# Patient Record
Sex: Male | Born: 1975 | Race: White | Hispanic: No | Marital: Married | Smoking: Never smoker
Health system: Southern US, Community
[De-identification: ages and names within clinical notes are randomized; demographics above are authoritative.]

## PROBLEM LIST (undated history)

## (undated) DIAGNOSIS — T7840XA Allergy, unspecified, initial encounter: Secondary | ICD-10-CM

## (undated) DIAGNOSIS — E7800 Pure hypercholesterolemia, unspecified: Secondary | ICD-10-CM

## (undated) HISTORY — PX: SHOULDER SURGERY: SHX246

## (undated) HISTORY — PX: THROAT SURGERY: SHX803

## (undated) HISTORY — DX: Allergy, unspecified, initial encounter: T78.40XA

## (undated) HISTORY — PX: NASAL SINUS SURGERY: SHX719

## (undated) HISTORY — PX: WISDOM TOOTH EXTRACTION: SHX21

---

## 2009-02-16 ENCOUNTER — Emergency Department (HOSPITAL_COMMUNITY): Admission: EM | Admit: 2009-02-16 | Discharge: 2009-02-16 | Payer: Self-pay | Admitting: Family Medicine

## 2010-05-02 LAB — CULTURE, ROUTINE-ABSCESS

## 2011-05-07 ENCOUNTER — Emergency Department (HOSPITAL_COMMUNITY)
Admission: EM | Admit: 2011-05-07 | Discharge: 2011-05-07 | Disposition: A | Discharge status: Home / Self Care | Payer: BC Managed Care – PPO | Attending: Emergency Medicine | Admitting: Emergency Medicine

## 2011-05-07 ENCOUNTER — Emergency Department (INDEPENDENT_AMBULATORY_CARE_PROVIDER_SITE_OTHER)
Admission: EM | Admit: 2011-05-07 | Discharge: 2011-05-07 | Disposition: A | Discharge status: Nursing Home (Includes ICF) | Payer: BC Managed Care – PPO | Source: Home / Self Care | Attending: Emergency Medicine | Admitting: Emergency Medicine

## 2011-05-07 ENCOUNTER — Encounter (HOSPITAL_COMMUNITY): Payer: Self-pay | Admitting: Emergency Medicine

## 2011-05-07 ENCOUNTER — Emergency Department (HOSPITAL_COMMUNITY): Payer: BC Managed Care – PPO

## 2011-05-07 DIAGNOSIS — R42 Dizziness and giddiness: Secondary | ICD-10-CM

## 2011-05-07 DIAGNOSIS — Z7982 Long term (current) use of aspirin: Secondary | ICD-10-CM | POA: Insufficient documentation

## 2011-05-07 DIAGNOSIS — Y9229 Other specified public building as the place of occurrence of the external cause: Secondary | ICD-10-CM | POA: Insufficient documentation

## 2011-05-07 DIAGNOSIS — S0990XA Unspecified injury of head, initial encounter: Secondary | ICD-10-CM

## 2011-05-07 DIAGNOSIS — F0781 Postconcussional syndrome: Secondary | ICD-10-CM | POA: Insufficient documentation

## 2011-05-07 DIAGNOSIS — IMO0002 Reserved for concepts with insufficient information to code with codable children: Secondary | ICD-10-CM | POA: Insufficient documentation

## 2011-05-07 DIAGNOSIS — E7800 Pure hypercholesterolemia, unspecified: Secondary | ICD-10-CM | POA: Insufficient documentation

## 2011-05-07 DIAGNOSIS — R110 Nausea: Secondary | ICD-10-CM

## 2011-05-07 HISTORY — DX: Pure hypercholesterolemia, unspecified: E78.00

## 2011-05-07 MED ORDER — TETANUS-DIPHTH-ACELL PERTUSSIS 5-2.5-18.5 LF-MCG/0.5 IM SUSP
0.5000 mL | Freq: Once | INTRAMUSCULAR | Status: AC
  Administered 2011-05-07: 0.5 mL via INTRAMUSCULAR
  Filled 2011-05-07: qty 0.5

## 2011-05-07 MED ORDER — METOCLOPRAMIDE HCL 10 MG PO TABS: 10.0000 mg | ORAL_TABLET | Freq: Four times a day (QID) | ORAL | Status: AC | PRN

## 2011-05-07 NOTE — ED Notes (Signed)
Pt states he believes that he gave himself a concussion 2 days ago. Pt walked into low beam. Small abrasion on top of head. Pt states dizzy, nausea, HA, and lightheaded.

## 2011-05-07 NOTE — Discharge Instructions (Signed)
Concussion and Brain Injury A blow or jolt to the head can disrupt the normal function of the brain. This type of brain injury is often called a "concussion" or a "closed head injury." Concussions are usually not life-threatening. Even so, the effects of a concussion can be serious.  CAUSES  A concussion is caused by a blunt blow to the head. The blow might be direct or indirect as described below.  Direct blow (running into another player during a soccer game, being hit in a fight, or hitting your head on a hard surface).   Indirect blow (when your head moves rapidly and violently back and forth like in a car crash).  SYMPTOMS  The brain is very complex. Every head injury is different. Some symptoms may appear right away. Other symptoms may not show up for days or weeks after the concussion. The signs of concussion can be hard to notice. Early on, problems may be missed by patients, family members, and caregivers. You may look fine even though you are acting or feeling differently.  These symptoms are usually temporary, but may last for days, weeks, or even longer. Symptoms include:  Mild headaches that will not go away.   Having more trouble than usual with:   Remembering things.   Paying attention or concentrating.   Organizing daily tasks.   Making decisions and solving problems.   Slowness in thinking, acting, speaking, or reading.   Getting lost or easily confused.   Feeling tired all the time or lacking energy (fatigue).   Feeling drowsy.   Sleep disturbances.   Sleeping more than usual.   Sleeping less than usual.   Trouble falling asleep.   Trouble sleeping (insomnia).   Loss of balance or feeling lightheaded or dizzy.   Nausea or vomiting.   Numbness or tingling.   Increased sensitivity to:   Sounds.   Lights.   Distractions.  Other symptoms might include:  Vision problems or eyes that tire easily.   Diminished sense of taste or smell.   Ringing  in the ears.   Mood changes such as feeling sad, anxious, or listless.   Becoming easily irritated or angry for little or no reason.   Lack of motivation.  DIAGNOSIS  Your caregiver can usually diagnose a concussion or mild brain injury based on your description of your injury and your symptoms.  Your evaluation might include:  A brain scan to look for signs of injury to the brain. Even if the test shows no injury, you may still have a concussion.   Blood tests to be sure other problems are not present.  TREATMENT   People with a concussion need to be examined and evaluated. Most people with concussions are treated in an emergency department, urgent care, or clinic. Some people must stay in the hospital overnight for further treatment.   Your caregiver will send you home with important instructions to follow. Be sure to carefully follow them.   Tell your caregiver if you are already taking any medicines (prescription, over-the-counter, or natural remedies), or if you are drinking alcohol or taking illegal drugs. Also, talk with your caregiver if you are taking blood thinners (anticoagulants) or aspirin. These drugs may increase your chances of complications. All of this is important information that may affect treatment.   Only take over-the-counter or prescription medicines for pain, discomfort, or fever as directed by your caregiver.  PROGNOSIS  How fast people recover from brain injury varies from person to person.   Although most people have a good recovery, how quickly they improve depends on many factors. These factors include how severe their concussion was, what part of the brain was injured, their age, and how healthy they were before the concussion.  Because all head injuries are different, so is recovery. Most people with mild injuries recover fully. Recovery can take time. In general, recovery is slower in older persons. Also, persons who have had a concussion in the past or have  other medical problems may find that it takes longer to recover from their current injury. Anxiety and depression may also make it harder to adjust to the symptoms of brain injury. HOME CARE INSTRUCTIONS  Return to your normal activities slowly, not all at once. You must give your body and brain enough time for recovery.  Get plenty of sleep at night, and rest during the day. Rest helps the brain to heal.   Avoid staying up late at night.   Keep the same bedtime hours on weekends and weekdays.   Take daytime naps or rest breaks when you feel tired.   Limit activities that require a lot of thought or concentration (brain or cognitive rest). This includes:   Homework or job-related work.   Watching TV.   Computer work.   Avoid activities that could lead to a second brain injury, such as contact or recreational sports, until your caregiver says it is okay. Even after your brain injury has healed, you should protect yourself from having another concussion.   Ask your caregiver when you can return to your normal activities such as driving, bicycling, or operating heavy equipment. Your ability to react may be slower after a brain injury.   Talk with your caregiver about when you can return to work or school.   Inform your teachers, school nurse, school counselor, coach, Product/process development scientist, or work Freight forwarder about your injury, symptoms, and restrictions. They should be instructed to report:   Increased problems with attention or concentration.   Increased problems remembering or learning new information.   Increased time needed to complete tasks or assignments.   Increased irritability or decreased ability to cope with stress.   Increased symptoms.   Take only those medicines that your caregiver has approved.   Do not drink alcohol until your caregiver says you are well enough to do so. Alcohol and certain other drugs may slow your recovery and can put you at risk of further injury.    If it is harder than usual to remember things, write them down.   If you are easily distracted, try to do one thing at a time. For example, do not try to watch TV while fixing dinner.   Talk with family members or close friends when making important decisions.   Keep all follow-up appointments. Repeated evaluation of your symptoms is recommended for your recovery.  PREVENTION  Protect your head from future injury. It is very important to avoid another head or brain injury before you have recovered. In rare cases, another injury has lead to permanent brain damage, brain swelling, or death. Avoid injuries by using:  Seatbelts when riding in a car.   Alcohol only in moderation.   A helmet when biking, skiing, skateboarding, skating, or doing similar activities.   Safety measures in your home.   Remove clutter and tripping hazards from floors and stairways.   Use grab bars in bathrooms and handrails by stairs.   Place non-slip mats on floors and in bathtubs.  Improve lighting in dim areas.  SEEK MEDICAL CARE IF:  A head injury can cause lingering symptoms. You should seek medical care if you have any of the following symptoms for more than 3 weeks after your injury or are planning to return to sports:  Chronic headaches.   Dizziness or balance problems.   Nausea.   Vision problems.   Increased sensitivity to noise or light.   Depression or mood swings.   Anxiety or irritability.   Memory problems.   Difficulty concentrating or paying attention.   Sleep problems.   Feeling tired all the time.  SEEK IMMEDIATE MEDICAL CARE IF:  You have had a blow or jolt to the head and you (or your family or friends) notice:  Severe or worsening headaches.   Weakness (even if only in one hand or one leg or one part of the face), numbness, or decreased coordination.   Repeated vomiting.   Increased sleepiness or passing out.   One black center of the eye (pupil) is larger  than the other.   Convulsions (seizures).   Slurred speech.   Increasing confusion, restlessness, agitation, or irritability.   Lack of ability to recognize people or places.   Neck pain.   Difficulty being awakened.   Unusual behavior changes.   Loss of consciousness.  Older adults with a brain injury may have a higher risk of serious complications such as a blood clot on the brain. Headaches that get worse or an increase in confusion are signs of this complication. If these signs occur, see a caregiver right away. MAKE SURE YOU:   Understand these instructions.   Will watch your condition.   Will get help right away if you are not doing well or get worse.  FOR MORE INFORMATION  Several groups help people with brain injury and their families. They provide information and put people in touch with local resources. These include support groups, rehabilitation services, and a variety of health care professionals. Among these groups, the Brain Injury Association (BIA, www.biausa.org) has a Secretary/administrator that gathers scientific and educational information and works on a national level to help people with brain injury.  Document Released: 04/23/2003 Document Revised: 01/20/2011 Document Reviewed: 09/19/2007 [facility] Patient Information 2012 [place], [place].  You have had a head injury which does not appear to require admission at this time. A concussion is a state of changed mental ability from trauma. SEEK IMMEDIATE MEDICAL ATTENTION IF: There is confusion or drowsiness (although children frequently become drowsy after injury).  You cannot awaken the injured person.  There is nausea (feeling sick to your stomach) or continued, forceful vomiting.  You notice dizziness or unsteadiness which is getting worse, or inability to walk.  You have convulsions or unconsciousness.  You experience severe, persistent headaches not relieved by Tylenol?. (Do not take aspirin as this impairs  clotting abilities). Take other pain medications only as directed.  You cannot use arms or legs normally.  There are changes in pupil sizes. (This is the black center in the colored part of the eye)  There is clear or bloody discharge from the nose or ears.  Change in speech, vision, swallowing, or understanding.  Localized weakness, numbness, tingling, or change in bowel or bladder control.

## 2011-05-07 NOTE — ED Notes (Signed)
Patient transported to CT 

## 2011-05-07 NOTE — ED Provider Notes (Signed)
History     CSN: [id number]  Arrival date & time 05/07/11  1907   First MD Initiated Contact with Patient 05/07/11 2012      Chief Complaint  Patient presents with  . Head Injury    (Consider location/radiation/quality/duration/timing/severity/associated sxs/prior treatment) HPI This 36 year old male was on a business accidentally bumped the top of his head on a low edema and a restaurant causing a headache at that time with some persistent very mild headache and some mild nausea since that time. He is no amnesia no loss of consciousness no neck pain no weakness no numbness, coordination and no change in speech vision swallowing or understanding. He again has no neck pain he also is no back pain chest pain shortness breath or abdominal pain. Since he has persistent very mild headache with some mild nausea he was seen by the urgent care and sent to the ED for CT scan of his brain. Upon arrival to the ED the patient was given the option of having a CT scan or not he has decided to go ahead and get a CT scan ordered. He also received a tetanus shot this visit because he has a superficial small abrasion in the scalp and her mother the last tetanus shot. Past Medical History  Diagnosis Date  . Migraine   . High cholesterol     Past Surgical History  Procedure Date  . Shoulder surgery   . Nasal sinus surgery     No family history on file.  History  Substance Use Topics  . Smoking status: Never Smoker   . Smokeless tobacco: Not on file  . Alcohol Use: Yes      Review of Systems  Constitutional: Negative for fever.       10 Systems reviewed and are negative for acute change except as noted in the HPI.  HENT: Negative for congestion.   Eyes: Negative for discharge and redness.  Respiratory: Negative for cough and shortness of breath.   Cardiovascular: Negative for chest pain.  Gastrointestinal: Positive for nausea. Negative for vomiting and abdominal pain.  Musculoskeletal:  Negative for back pain.  Skin: Negative for rash.  Neurological: Positive for headaches. Negative for dizziness, syncope, facial asymmetry, speech difficulty, weakness and numbness.  Psychiatric/Behavioral:       No behavior change.    Allergies  Review of patient's allergies indicates no known allergies.  Home Medications   Current Outpatient Rx  Name Route Sig Dispense Refill  . ASPIRIN 81 MG PO TABS Oral Take 81 mg by mouth daily.    . ATENOLOL PO Oral Take 1 tablet by mouth daily.     Marland Kitchen LIPITOR PO Oral Take 1 tablet by mouth daily.     . IBUPROFEN 200 MG PO TABS Oral Take 200 mg by mouth every 6 (six) hours as needed. For pain    . MAXALT-MLT PO Oral Take 1 tablet by mouth 2 (two) times daily as needed. For migraine     . METOCLOPRAMIDE HCL 10 MG PO TABS Oral Take 1 tablet (10 mg total) by mouth every 6 (six) hours as needed (nausea/headache). 6 tablet 0    BP 116/71  Pulse 48  Temp(Src) 98.2 F (36.8 C) (Oral)  Resp 18  SpO2 100%  Physical Exam  Nursing note and vitals reviewed. Constitutional:       Awake, alert, nontoxic appearance with baseline speech for patient.  HENT:  Mouth/Throat: No oropharyngeal exudate.       Minimal superficial  abrasion vertex of the scalp  Eyes: EOM are normal. Pupils are equal, round, and reactive to light. Right eye exhibits no discharge. Left eye exhibits no discharge.  Neck: Neck supple.  Cardiovascular: Normal rate and regular rhythm.   No murmur heard. Pulmonary/Chest: Effort normal and breath sounds normal. No stridor. No respiratory distress. He has no wheezes. He has no rales. He exhibits no tenderness.  Abdominal: Soft. Bowel sounds are normal. He exhibits no mass. There is no tenderness. There is no rebound.  Musculoskeletal: He exhibits no tenderness.       Baseline ROM, moves extremities with no obvious new focal weakness.  Lymphadenopathy:    He has no cervical adenopathy.  Neurological: He is alert.       Awake, alert,  cooperative and aware of situation; motor strength bilaterally; sensation normal to light touch bilaterally; peripheral visual fields full to confrontation; no facial asymmetry; tongue midline; major cranial nerves appear intact; no pronator drift, normal finger to nose bilaterally, baseline gait without new ataxia.  Skin: No rash noted.  Psychiatric: He has a normal mood and affect.    ED Course  Procedures (including critical care time)  Labs Reviewed - No data to display Ct Head Wo Contrast  05/07/2011  *RADIOLOGY REPORT*  Clinical Data: Hit on top of head 5 days ago, laceration, slight headache, nausea, dizziness  CT HEAD WITHOUT CONTRAST  Technique:  Contiguous axial images were obtained from the base of the skull through the vertex without contrast.  Comparison: None  Findings: Normal ventricular morphology. No midline shift or mass effect. Normal appearance of brain parenchyma. No intracranial hemorrhage, mass lesion, or acute infarction. Visualized paranasal sinuses and mastoid air cells clear. Bones unremarkable.  IMPRESSION: No acute intracranial abnormalities.  Original Report Authenticated By: Lollie Marrow, M.D.     1. Postconcussive syndrome       MDM  Patient / Family / Caregiver understand and agree with initial ED impression and plan with expectations set for ED visit.  Pt stable in ED with no significant deterioration in condition.  Patient / Family / Caregiver informed of clinical course, understand medical decision-making process, and agree with plan.  I doubt any other EMC precluding discharge at this time including, but not necessarily limited to the following:[facility].        Hurman Horn, MD 05/09/11 [phone]

## 2011-05-07 NOTE — ED Notes (Addendum)
Patient ran into a low lying ceiling.  Patient reports concentration issues, intermitent headaches, fussy headed, intermittent, mild nausea.  No loc.  Patient was out of town for this event.  Returned to town last night.  Patient does have a scabbed scratch to top of head

## 2011-05-07 NOTE — ED Notes (Signed)
Unknown last tetanus

## 2011-05-07 NOTE — ED Provider Notes (Signed)
History     CSN: [id number]  Arrival date & time 05/07/11  1644   First MD Initiated Contact with Patient 05/07/11 1648      Chief Complaint  Patient presents with  . Head Injury    (Consider location/radiation/quality/duration/timing/severity/associated sxs/prior treatment) HPI Comments: Patient presents urgent care tonight complaining of ongoing symptoms which includes dizziness, feeling lightheaded, mild nausea, after having sustained a head injury to his last Tuesday. As patient described he was walking in a restaurant when he hit and ran into a wooden pole and a restaurant. Patient describes that he didn't lose consciousness, but was feeling very hot and somewhat disoriented and clumsy with his movements and even with some ambulation problems was feeling dizzy and lightheaded.  Patient describes that he continues to feel dizzy and feels nauseous mildly have not vomited, and denies any other further symptoms such as muscle weakness, numbness or tingling, no visual disturbances or changes in have not vomited.  Patient is a 36 y.o. male presenting with head injury. The history is provided by the patient.  Head Injury  The incident occurred more than 2 days ago. He came to the ER via walk-in. The injury mechanism was a direct blow. There was no loss of consciousness. There was no blood loss. The quality of the pain is described as sharp. The pain is at a severity of 6/10. The pain has been constant since the injury. Pertinent negatives include no numbness, no blurred vision, no vomiting, no tinnitus, no disorientation, no weakness and no memory loss. He was found conscious by EMS personnel. He has tried NSAIDs for the symptoms. The treatment provided mild relief.    Past Medical History  Diagnosis Date  . Migraine   . High cholesterol     Past Surgical History  Procedure Date  . Shoulder surgery   . Nasal sinus surgery     No family history on file.  History  Substance Use  Topics  . Smoking status: Never Smoker   . Smokeless tobacco: Not on file  . Alcohol Use: Yes      Review of Systems  Constitutional: Negative for chills and diaphoresis.  HENT: Negative for hearing loss, neck pain, neck stiffness and tinnitus.   Eyes: Negative for blurred vision, pain and visual disturbance.  Gastrointestinal: Positive for nausea. Negative for vomiting.  Genitourinary: Negative for dysuria.  Musculoskeletal: Negative for joint swelling.  Neurological: Positive for dizziness. Negative for tremors, syncope, speech difficulty, weakness and numbness.  Psychiatric/Behavioral: Negative for memory loss.    Allergies  Review of patient's allergies indicates no known allergies.  Home Medications   Current Outpatient Rx  Name Route Sig Dispense Refill  . ASPIRIN 81 MG PO TABS Oral Take 81 mg by mouth daily.    . ATENOLOL PO Oral Take by mouth.    Marland Kitchen LIPITOR PO Oral Take by mouth.    . IBUPROFEN 200 MG PO TABS Oral Take 200 mg by mouth every 6 (six) hours as needed.    Marland Kitchen MAXALT-MLT PO Oral Take by mouth.      BP 111/65  Pulse 54  Temp(Src) 98.3 F (36.8 C) (Oral)  Resp 16  SpO2 100%  Physical Exam  Nursing note and vitals reviewed. Constitutional: He appears well-developed and well-nourished.  HENT:  Head: Normocephalic.    Eyes: Conjunctivae are normal. Pupils are equal, round, and reactive to light. No scleral icterus. Right pupil is reactive. Left pupil is reactive.  Neck: Neck supple. No JVD  present.  Cardiovascular: Normal rate.   Pulmonary/Chest: Effort normal.  Abdominal: Soft.  Lymphadenopathy:    He has no cervical adenopathy.  Neurological: He is alert. He displays no atrophy, no tremor and normal reflexes. No cranial nerve deficit or sensory deficit. He exhibits normal muscle tone. He displays a negative Romberg sign. He displays no seizure activity. Coordination and gait normal.  Skin: Skin is warm. No erythema.    ED Course  Procedures  (including critical care time)  Labs Reviewed - No data to display No results found.   No diagnosis found.    MDM  Post head injury 5 days patient describing multiple symptomatology which includes dizziness, nauseous, lightheadedness, concentration. Patient has a normal neurological exam at urgent care. On exam patient seem to have a localized area of a skull periostial reaction. Have decided to transfer patient to be consider for imaging as patient continues to feel nauseous and dizzy.        Jimmie Molly, MD 05/07/11 [phone]

## 2012-10-13 ENCOUNTER — Encounter (HOSPITAL_COMMUNITY): Payer: Self-pay

## 2012-10-13 ENCOUNTER — Emergency Department (HOSPITAL_COMMUNITY)
Admission: EM | Admit: 2012-10-13 | Discharge: 2012-10-13 | Disposition: A | Discharge status: Home / Self Care | Payer: BC Managed Care – PPO | Attending: Emergency Medicine | Admitting: Emergency Medicine

## 2012-10-13 DIAGNOSIS — E7800 Pure hypercholesterolemia, unspecified: Secondary | ICD-10-CM | POA: Insufficient documentation

## 2012-10-13 DIAGNOSIS — R1310 Dysphagia, unspecified: Secondary | ICD-10-CM | POA: Insufficient documentation

## 2012-10-13 DIAGNOSIS — Z79899 Other long term (current) drug therapy: Secondary | ICD-10-CM | POA: Diagnosis not present

## 2012-10-13 DIAGNOSIS — IMO0002 Reserved for concepts with insufficient information to code with codable children: Secondary | ICD-10-CM | POA: Insufficient documentation

## 2012-10-13 DIAGNOSIS — G43909 Migraine, unspecified, not intractable, without status migrainosus: Secondary | ICD-10-CM | POA: Diagnosis not present

## 2012-10-13 DIAGNOSIS — Z7982 Long term (current) use of aspirin: Secondary | ICD-10-CM | POA: Insufficient documentation

## 2012-10-13 DIAGNOSIS — Y838 Other surgical procedures as the cause of abnormal reaction of the patient, or of later complication, without mention of misadventure at the time of the procedure: Secondary | ICD-10-CM | POA: Diagnosis not present

## 2012-10-13 DIAGNOSIS — J029 Acute pharyngitis, unspecified: Secondary | ICD-10-CM | POA: Diagnosis not present

## 2012-10-13 NOTE — Consult Note (Signed)
Patient had a right tonsillectomy yesterday at the outpatient center for some sort of neoplasm that was felt to be benign. He started having some bleeding a couple of hours ago and it seemed to be getting a little bit worse. He tried gargling with ice water to no avail. He is otherwise are healthy.  On exam, there is fresh blood coming from the right superior tonsil pole from what appears to be a small arterial. I had him gargle with icewater/peroxide and I was able to inject 1% Xylocaine with epinephrine into the superior tonsillar pole. The bleeding stopped. I cleaned off the blood clot. There was no further bleeding. There were 2 suspicious-looking areas in the right superior tonsillar bed that I cauterized with silver nitrate. There was is still no further bleeding.  He is instructed to resume his diet and to contact me if there is any further bleeding.

## 2012-10-13 NOTE — ED Notes (Signed)
Pt. Had surgery done yesterday, throat surgery mass removed.  Began actively bleeding about 1 hour ago,.   Ent Dr. Pollyann Kennedy is on his way

## 2012-10-13 NOTE — ED Provider Notes (Signed)
CSN: [id number]     Arrival date & time 10/13/12  1251 History   First MD Initiated Contact with Patient 10/13/12 1304     Chief Complaint  Patient presents with  . Bleeding/Bruising   (Consider location/radiation/quality/duration/timing/severity/associated sxs/prior Treatment) HPI Comments: Patient with bleeding x 1hr from site of tonsillar cyst surgery performed yesterday. Patient tried gargling with cold water PTA without relief. No N/V. No lightheadedness or syncope. Pt called ENT and was told to go to ED, Dr. Pollyann Kennedy to see. The onset of this condition was acute. The course is constant. Aggravating factors: none. Alleviating factors: none.     The history is provided by the patient.    Past Medical History  Diagnosis Date  . Migraine   . High cholesterol    Past Surgical History  Procedure Laterality Date  . Shoulder surgery    . Nasal sinus surgery     No family history on file. History  Substance Use Topics  . Smoking status: Never Smoker   . Smokeless tobacco: Not on file  . Alcohol Use: Yes    Review of Systems  Constitutional: Negative for fever.  HENT: Positive for sore throat and trouble swallowing. Negative for facial swelling.        + bleeding from throat  Gastrointestinal: Negative for nausea and vomiting.  Skin: Positive for wound.  Neurological: Negative for syncope and light-headedness.    Allergies  Review of patient's allergies indicates no known allergies.  Home Medications   Current Outpatient Rx  Name  Route  Sig  Dispense  Refill  . aspirin 81 MG tablet   Oral   Take 81 mg by mouth daily.         . ATENOLOL PO   Oral   Take 1 tablet by mouth daily.          . Atorvastatin Calcium (LIPITOR PO)   Oral   Take 1 tablet by mouth daily.          Marland Kitchen ibuprofen (ADVIL,MOTRIN) 200 MG tablet   Oral   Take 200 mg by mouth every 6 (six) hours as needed. For pain         . Rizatriptan Benzoate (MAXALT-MLT PO)   Oral   Take 1 tablet by  mouth 2 (two) times daily as needed. For migraine           BP 103/87  Pulse 116  Resp 18  SpO2 99% Physical Exam  Nursing note and vitals reviewed. Constitutional: He appears well-developed and well-nourished.  HENT:  Head: Normocephalic and atraumatic.  Edema R tonsillar area with oozing of blood noted. No other lesions or facial swelling. Patient handling secretions.   Eyes: Conjunctivae are normal.  Neck: Normal range of motion. Neck supple.  Pulmonary/Chest: No respiratory distress.  Neurological: He is alert.  Skin: Skin is warm and dry.  Psychiatric: He has a normal mood and affect.    ED Course  Procedures (including critical care time) Labs Review Labs Reviewed - No data to display Imaging Review No results found.  1:14 PM Patient seen and examined. Dr. Pollyann Kennedy at bedside.    Vital signs reviewed and are as follows: Filed Vitals:   10/13/12 1258  BP: 103/87  Pulse: 116  Resp: 18   1:52 PM Dr. Pollyann Kennedy has performed cauterization. Patient ready for discharge.    MDM   1. Postoperative hemorrhage of tonsil   2. Postoperative bleeding from mouth    Bleeding after ENT procedure  yesterday -- controlled. D/c to home. Do not suspect significant anemia.     Renne Crigler, PA-C 10/13/12 1353

## 2012-10-14 NOTE — ED Provider Notes (Signed)
Medical screening examination/treatment/procedure(s) were performed by non-physician practitioner and as supervising physician I was immediately available for consultation/collaboration.   Claudean Kinds, MD 10/14/12 [phone]
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
## 2016-03-01 DIAGNOSIS — E785 Hyperlipidemia, unspecified: Secondary | ICD-10-CM | POA: Insufficient documentation

## 2016-03-01 DIAGNOSIS — G43909 Migraine, unspecified, not intractable, without status migrainosus: Secondary | ICD-10-CM | POA: Insufficient documentation

## 2017-05-15 DIAGNOSIS — R6889 Other general symptoms and signs: Secondary | ICD-10-CM | POA: Diagnosis not present

## 2017-11-29 DIAGNOSIS — E7849 Other hyperlipidemia: Secondary | ICD-10-CM | POA: Diagnosis not present

## 2017-11-29 DIAGNOSIS — G43909 Migraine, unspecified, not intractable, without status migrainosus: Secondary | ICD-10-CM | POA: Diagnosis not present

## 2017-11-29 DIAGNOSIS — J309 Allergic rhinitis, unspecified: Secondary | ICD-10-CM | POA: Diagnosis not present

## 2018-02-26 DIAGNOSIS — H9202 Otalgia, left ear: Secondary | ICD-10-CM | POA: Insufficient documentation

## 2018-02-26 DIAGNOSIS — M2669 Other specified disorders of temporomandibular joint: Secondary | ICD-10-CM | POA: Insufficient documentation

## 2018-03-30 ENCOUNTER — Encounter (HOSPITAL_COMMUNITY): Payer: Self-pay | Admitting: Emergency Medicine

## 2018-03-30 ENCOUNTER — Emergency Department (HOSPITAL_COMMUNITY): Payer: 59

## 2018-03-30 ENCOUNTER — Emergency Department (HOSPITAL_COMMUNITY)
Admission: EM | Admit: 2018-03-30 | Discharge: 2018-03-30 | Disposition: A | Discharge status: Home / Self Care | Payer: 59 | Attending: Emergency Medicine | Admitting: Emergency Medicine

## 2018-03-30 ENCOUNTER — Other Ambulatory Visit: Payer: Self-pay

## 2018-03-30 DIAGNOSIS — Z7982 Long term (current) use of aspirin: Secondary | ICD-10-CM | POA: Insufficient documentation

## 2018-03-30 DIAGNOSIS — R42 Dizziness and giddiness: Secondary | ICD-10-CM | POA: Diagnosis not present

## 2018-03-30 DIAGNOSIS — R0789 Other chest pain: Secondary | ICD-10-CM | POA: Insufficient documentation

## 2018-03-30 DIAGNOSIS — E7800 Pure hypercholesterolemia, unspecified: Secondary | ICD-10-CM | POA: Insufficient documentation

## 2018-03-30 DIAGNOSIS — Z79899 Other long term (current) drug therapy: Secondary | ICD-10-CM | POA: Diagnosis not present

## 2018-03-30 DIAGNOSIS — R079 Chest pain, unspecified: Secondary | ICD-10-CM | POA: Diagnosis present

## 2018-03-30 LAB — CBC
HEMATOCRIT: 45.2 % (ref 39.0–52.0)
Hemoglobin: 15.5 g/dL (ref 13.0–17.0)
MCH: 32.2 pg (ref 26.0–34.0)
MCHC: 34.3 g/dL (ref 30.0–36.0)
MCV: 94 fL (ref 80.0–100.0)
NRBC: 0 % (ref 0.0–0.2)
PLATELETS: 339 10*3/uL (ref 150–400)
RBC: 4.81 MIL/uL (ref 4.22–5.81)
RDW: 11.8 % (ref 11.5–15.5)
WBC: 6.7 10*3/uL (ref 4.0–10.5)

## 2018-03-30 LAB — BASIC METABOLIC PANEL
Anion gap: 12 (ref 5–15)
BUN: 14 mg/dL (ref 6–20)
CALCIUM: 9.4 mg/dL (ref 8.9–10.3)
CO2: 25 mmol/L (ref 22–32)
CREATININE: 1.11 mg/dL (ref 0.61–1.24)
Chloride: 102 mmol/L (ref 98–111)
GFR calc Af Amer: >60 mL/min (ref >60)
GFR calc non Af Amer: >60 mL/min (ref >60)
GLUCOSE: 139 mg/dL — AB (ref 70–99)
Potassium: 3.6 mmol/L (ref 3.5–5.1)
Sodium: 139 mmol/L (ref 135–145)

## 2018-03-30 LAB — I-STAT TROPONIN, ED
TROPONIN I, POC: 0 ng/mL (ref 0.00–0.08)
Troponin i, poc: 0.02 ng/mL (ref 0.00–0.08)

## 2018-03-30 MED ORDER — SODIUM CHLORIDE 0.9% FLUSH: 3.0000 mL | Freq: Once | INTRAVENOUS | Status: DC

## 2018-03-30 NOTE — ED Notes (Signed)
Patient transported to x-ray. ?

## 2018-03-30 NOTE — ED Notes (Signed)
Patient transported to X-ray 

## 2018-03-30 NOTE — ED Triage Notes (Signed)
Pt to ER for evaluation of central chest discomfort, shortness of breath, and light headedness onset one hour ago while sitting at his desk at work. Hx of hyperlipidemia and migraines.

## 2018-03-30 NOTE — ED Provider Notes (Signed)
[facility] EMERGENCY DEPARTMENT Provider Note   CSN: [id number] Arrival date & time: 03/30/18  1122     History   Chief Complaint Chief Complaint  Patient presents with  . Chest Pain  . Shortness of Breath    HPI Antonio Craig is a 43 y.o. male with a PMH of HLD and Migraines presenting with middle non radiating chest discomfort onset 1 hour ago while sitting at his desk at work. Patient describes pain as a tightness and states nothing makes it worse. Patient states pain has improved with rest. Patient reports associated shortness of breath initially, but states symptoms have improved. Patient reports nausea, but states it has resolved. Patient denies vomiting or abdominal pain. Patient states he felt lightheadedness prior to the chest discomfort. Patient reports anxiety. Patient denies dizziness, weakness, or headaches. Patient denies recent travel, recent surgery, leg edema/pain, or hx DVT/PE. Patient denies a personal or family hx of cardiac problems. Patient denies alcohol, tobacco, or drug use. Patient denies any injuries.   HPI  Past Medical History:  Diagnosis Date  . High cholesterol   . Migraine     There are no active problems to display for this patient.   Past Surgical History:  Procedure Laterality Date  . NASAL SINUS SURGERY    . SHOULDER SURGERY          Home Medications    Prior to Admission medications   Medication Sig Start Date End Date Taking? Authorizing Provider  AJOVY 225 MG/1.5ML SOSY Inject 675 mg into the skin every 3 (three) months.  03/26/18  Yes [provider]  aspirin 81 MG tablet Take 81 mg by mouth daily.   Yes [provider]  atorvastatin (LIPITOR) 20 MG tablet Take 20 mg by mouth daily. 02/28/18  Yes [provider]  ibuprofen (ADVIL,MOTRIN) 200 MG tablet Take 200 mg by mouth every 6 (six) hours as needed (for pain).    Yes [provider]  SUMAtriptan (IMITREX) 50 MG tablet Take 50  mg by mouth once as needed for migraine.  03/23/18  Yes [provider]    Family History History reviewed. No pertinent family history.  Social History Social History   Tobacco Use  . Smoking status: Never Smoker  Substance Use Topics  . Alcohol use: Yes  . Drug use: No     Allergies   Patient has no known allergies.   Review of Systems Review of Systems  Constitutional: Negative for activity change, appetite change, chills, diaphoresis, fatigue, fever and unexpected weight change.  HENT: Negative for congestion and rhinorrhea.   Eyes: Negative for visual disturbance.  Respiratory: Positive for chest tightness and shortness of breath. Negative for cough and wheezing.   Cardiovascular: Positive for chest pain. Negative for palpitations and leg swelling.  Gastrointestinal: Negative for abdominal pain, nausea and vomiting.  Endocrine: Negative for cold intolerance and heat intolerance.  Musculoskeletal: Negative for back pain.  Skin: Negative for rash.  Allergic/Immunologic: Negative for immunocompromised state.  Neurological: Positive for light-headedness. Negative for dizziness, seizures, syncope, speech difficulty, weakness, numbness and headaches.  Psychiatric/Behavioral: Negative for agitation and behavioral problems. The patient is nervous/anxious.      Physical Exam Updated Vital Signs BP (!) 141/82 (BP Location: Right Arm)   Pulse 93   Temp 97.7 F (36.5 C) (Oral)   Resp 18   Ht 6\' 4"  (1.93 m)   Wt 97.5 kg   SpO2 100%   BMI 26.17 kg/m  Physical Exam Vitals signs and nursing note reviewed.  Constitutional:      General: He is not in acute distress.    Appearance: He is well-developed. He is not diaphoretic.  HENT:     Head: Normocephalic and atraumatic.  Eyes:     Extraocular Movements: Extraocular movements intact.     Pupils: Pupils are equal, round, and reactive to light.  Neck:     Musculoskeletal: Normal range of motion and neck supple.       Vascular: No JVD.  Cardiovascular:     Rate and Rhythm: Normal rate and regular rhythm.     Pulses: Normal pulses.          Radial pulses are 2+ on the right side and 2+ on the left side.       Dorsalis pedis pulses are 2+ on the right side and 2+ on the left side.     Heart sounds: Normal heart sounds. No murmur. No friction rub. No gallop.   Pulmonary:     Effort: Pulmonary effort is normal. No respiratory distress.     Breath sounds: Normal breath sounds. No wheezing or rales.  Chest:     Chest wall: No tenderness.  Abdominal:     Palpations: Abdomen is soft.     Tenderness: There is no abdominal tenderness.  Musculoskeletal: Normal range of motion.     Right lower leg: He exhibits no tenderness. No edema.     Left lower leg: He exhibits no tenderness. No edema.  Skin:    General: Skin is warm.     Capillary Refill: Capillary refill takes less than 2 seconds.     Coloration: Skin is not pale.     Findings: No rash.  Neurological:     Mental Status: He is alert and oriented to person, place, and time.  Psychiatric:        Attention and Perception: Attention normal.        Mood and Affect: Mood is anxious.        Speech: Speech normal.        Behavior: Behavior normal. Behavior is cooperative.        Thought Content: Thought content normal.        Cognition and Memory: Cognition normal.    Mental Status:  Alert, oriented, thought content appropriate, able to give a coherent history. Speech fluent without evidence of aphasia. Able to follow 2 step commands without difficulty.  Cranial Nerves:  II:  Peripheral visual fields grossly normal, pupils equal, round, reactive to light III,IV, VI: ptosis not present, extra-ocular motions intact bilaterally  V,VII: smile symmetric, facial light touch sensation equal VIII: hearing grossly normal to voice  X: uvula elevates symmetrically  XI: bilateral shoulder shrug symmetric and strong XII: midline tongue extension without  fassiculations Motor:  Normal tone. 5/5 in upper and lower extremities bilaterally including strong and equal grip strength and dorsiflexion/plantar flexion Sensory: light touch normal in all extremities.  Deep Tendon Reflexes: 2+ and symmetric in the biceps and patella Cerebellar: normal finger-to-nose with bilateral upper extremities Gait: normal gait and balance.  CV: distal pulses palpable throughout   ED Treatments / Results  Labs (all labs ordered are listed, but only abnormal results are displayed) Labs Reviewed  BASIC METABOLIC PANEL - Abnormal; Notable for the following components:      Result Value   Glucose, Bld 139 (*)    All other components within normal limits  CBC  I-STAT TROPONIN, ED  I-STAT TROPONIN, ED    EKG EKG Interpretation  Date/Time:  Friday March 30 2018 11:32:09 EST Ventricular Rate:  72 PR Interval:  158 QRS Duration: 106 QT Interval:  380 QTC Calculation: 416 R Axis:   68 Text Interpretation:  Normal sinus rhythm with sinus arrhythmia no acute ST/T changes No old tracing to compare Confirmed by Pricilla LovelessGoldston, [place] [phone]([id number]) on 03/30/2018 11:43:39 AM Also confirmed by Pricilla LovelessGoldston, [place] [phone]), editor Elita QuickWatlington, [place] [phone]([id number])  on 03/30/2018 2:03:03 PM   Radiology Dg Chest 2 View  Result Date: 03/30/2018 CLINICAL DATA:  Chest pain.  Dizziness and weakness. EXAM: CHEST - 2 VIEW COMPARISON:  None. FINDINGS: The heart size and mediastinal contours are within normal limits. Both lungs are clear. The visualized skeletal structures are unremarkable. IMPRESSION: Normal exam. Electronically Signed   By: Francene BoyersJames  Maxwell M.D.   On: 03/30/2018 12:52    Procedures Procedures (including critical care time)  Medications Ordered in ED Medications  sodium chloride flush (NS) 0.9 % injection 3 mL (3 mLs Intravenous Not Given 03/30/18 1227)     Initial Impression / Assessment and Plan / ED Course  I have reviewed the triage vital signs and the nursing  notes.  Pertinent labs & imaging results that were available during my care of the patient were reviewed by me and considered in my medical decision making (see chart for details).  Clinical Course as of Mar 30 1617  Fri Mar 30, 2018  1257 Negative CXR for an acute abnormality.  DG Chest 2 View [AH]    Clinical Course User Index [AH] Leretha DykesHernandez, Lakayla Barrington P, [place]PA-C   Patient presents with chest discomfort and lightheadedness. Symptoms have resolved while in the ER. Patient is to be discharged with recommendation to follow up with PCP in regards to today's hospital visit. Chest pain is not likely of cardiac or pulmonary etiology d/t presentation, PERC negative, VSS, no tracheal deviation, no JVD or new murmur, RRR, breath sounds equal bilaterally, EKG without acute abnormalities, negative troponin x 2, and negative CXR. Pt has been advised to return to the ED if CP becomes exertional, associated with diaphoresis or nausea, radiates to left jaw/arm, worsens or becomes concerning in any way. Heart score is 1. Discussed elevated BP with patient and advised patient to follow up with PCP for BP control. Pt appears reliable for follow up and is agreeable to discharge.   Final Clinical Impressions(s) / ED Diagnoses   Final diagnoses:  Atypical chest pain  Lightheadedness    ED Discharge Orders    None       Leretha DykesHernandez, Tiziana Cislo P, [place]PA-C 03/30/18 1619    Pricilla LovelessGoldston, [place], MD 03/31/18 [phone]

## 2018-03-30 NOTE — ED Notes (Signed)
Reports chest discomfort didn't start til in the car driving here.

## 2018-03-30 NOTE — Discharge Instructions (Addendum)
You have been seen today for chest pain and lightheadedness. Please read and follow all provided instructions.   1. Medications: usual home medications 2. Treatment: rest, drink plenty of fluids 3. Follow Up: Please follow up with your primary doctor in 2 days for discussion of your diagnoses and further evaluation after today's visit; if you do not have a primary care doctor use the resource guide provided to find one; Please return to the ER for any new or worsening symptoms. Please obtain all of your results from medical records or have your doctors office obtain the results - share them with your doctor - you should be seen at your doctors office. Call today to arrange your follow up.   Take medications as prescribed. Please review all of the medicines and only take them if you do not have an allergy to them. Return to the emergency room for worsening condition or new concerning symptoms. Follow up with your regular doctor. If you don't have a regular doctor use one of the numbers below to establish a primary care doctor.  Please be aware that if you are taking birth control pills, taking other prescriptions, ESPECIALLY ANTIBIOTICS may make the birth control ineffective - if this is the case, either do not engage in sexual activity or use alternative methods of birth control such as condoms until you have finished the medicine and your family doctor says it is OK to restart them. If you are on a blood thinner such as COUMADIN, be aware that any other medicine that you take may cause the coumadin to either work too much, or not enough - you should have your coumadin level rechecked in next 7 days if this is the case.  ?  It is also a possibility that you have an allergic reaction to any of the medicines that you have been prescribed - Everybody reacts differently to medications and while MOST people have no trouble with most medicines, you may have a reaction such as nausea, vomiting, rash, swelling,  shortness of breath. If this is the case, please stop taking the medicine immediately and contact your physician.  ?  You should return to the ER if you develop severe or worsening symptoms.   Emergency Department Resource Guide 1) Find a Doctor and Pay Out of Pocket Although you won't have to find out who is covered by your insurance plan, it is a good idea to ask around and get recommendations. You will then need to call the office and see if the doctor you have chosen will accept you as a new patient and what types of options they offer for patients who are self-pay. Some doctors offer discounts or will set up payment plans for their patients who do not have insurance, but you will need to ask so you aren't surprised when you get to your appointment.  2) Contact Your Local Health Department Not all health departments have doctors that can see patients for sick visits, but many do, so it is worth a call to see if yours does. If you don't know where your local health department is, you can check in your phone book. The CDC also has a tool to help you locate your state's health department, and many state websites also have listings of all of their local health departments.  3) Find a Walk-in Clinic If your illness is not likely to be very severe or complicated, you may want to try a walk in clinic. These are popping up  all over the country in pharmacies, drugstores, and shopping centers. They're usually staffed by nurse practitioners or physician assistants that have been trained to treat common illnesses and complaints. They're usually fairly quick and inexpensive. However, if you have serious medical issues or chronic medical problems, these are probably not your best option.  No Primary Care Doctor: Call Health Connect at  [phone] - they can help you locate a primary care doctor that  accepts your insurance, provides certain services, etc. Physician Referral [phone]  Emergency  Department Resource Guide 1) Find a Doctor and Pay Out of Pocket Although you won't have to find out who is covered by your insurance plan, it is a good idea to ask around and get recommendations. You will then need to call the office and see if the doctor you have chosen will accept you as a new patient and what types of options they offer for patients who are self-pay. Some doctors offer discounts or will set up payment plans for their patients who do not have insurance, but you will need to ask so you aren't surprised when you get to your appointment.  2) Contact Your Local Health Department Not all health departments have doctors that can see patients for sick visits, but many do, so it is worth a call to see if yours does. If you don't know where your local health department is, you can check in your phone book. The CDC also has a tool to help you locate your state's health department, and many state websites also have listings of all of their local health departments.  3) Find a [facility] If your illness is not likely to be very severe or complicated, you may want to try a walk in clinic. These are popping up all over the country in pharmacies, drugstores, and shopping centers. They're usually staffed by nurse practitioners or physician assistants that have been trained to treat common illnesses and complaints. They're usually fairly quick and inexpensive. However, if you have serious medical issues or chronic medical problems, these are probably not your best option.  No Primary Care Doctor: Call Health Connect at  [phone] - they can help you locate a primary care doctor that  accepts your insurance, provides certain services, etc. Physician Referral Service- [phone]  Chronic Pain Problems: Organization         Address  Phone   Notes  [facility]  [phone] Patients need to be referred by their primary care doctor.   Medication  Assistance: Organization         Address  Phone   Notes  [facility] [address] [place], [place] [address] [phone] --Must be a resident of [facility] -- Must have NO insurance coverage whatsoever (no Medicaid/ Medicare, etc.) -- The pt. MUST have a primary care doctor that directs their care regularly and follows them in the community   [facility]  [phone]   [employer]  [phone]    Agencies that provide inexpensive medical care: Organization         Address  Phone   Notes  [place]  [phone]   [facility]    [phone]   [facility] [place], [place] [address] [phone]   [place] [address], [place] [phone]   [facility]    [phone]  [facility]    [phone]   [facility]  [address], [place] Phone:  [phone], Fax:  [phone] Hours of Operation:  9 am - 6 pm, M-F.  Also accepts Medicaid/Medicare and self-pay.  [facility] for [address], [address], [place] Phone: [phone], Fax: [phone]. Hours of Operation:  8:30 am - 5:30 pm, M-F.  Also accepts Medicaid and self-pay.  [facility] [place] [address], [place] Phone: [phone]   [place], [place], [place] [phone], [phone] Mondays & Thursdays: 7-9 AM.  First 15 patients are seen on a first come, first serve basis.    [place] Providers:  Organization         Address  Phone   Notes  [facility] [address], [place] [phone] Also accepts self-pay patients.  [facility] [address], [place]  [phone]   [place], [address], [place]  [phone]   [facility] [address] [place] [phone]   Lucianne Lei [address], [place]   [phone] Only accepts [place] [facility] [place] patients after they have their name applied to their card.   Self-Pay (no insurance) in [facility]:  Organization         Address  Phone   Notes  Sickle Cell Patients, [facility] [phone]   [facility] [place] [phone]   [facility] [place]  [place], [place], [place] [phone]   [facility]/Dr. Osei-Bonsu  [address] [place] or [address], [place] [phone] Phone number for both [place] and [place] locations is the same.  [facility] [address], [place] [phone]   [facility] [address], [place] or [address] [phone]   [facility] [address], [place] [phone], phone; [phone], fax Sees patients 1st and 3rd Saturday of every month.  Must not qualify for public or private insurance (i.e. Medicaid, Medicare, [place] Health Choice, Veterans' Benefits)  Household income should be no more than 200% of the poverty level The clinic cannot treat you if you are pregnant or think you are pregnant  Sexually transmitted diseases are not treated at the clinic.

## 2018-04-04 ENCOUNTER — Other Ambulatory Visit: Payer: Self-pay | Admitting: Internal Medicine

## 2018-12-19 ENCOUNTER — Other Ambulatory Visit: Payer: Self-pay

## 2018-12-19 DIAGNOSIS — Z20822 Contact with and (suspected) exposure to covid-19: Secondary | ICD-10-CM

## 2018-12-20 LAB — NOVEL CORONAVIRUS, NAA: SARS-CoV-2, NAA: NOT DETECTED

## 2019-03-18 ENCOUNTER — Ambulatory Visit: Payer: PRIVATE HEALTH INSURANCE | Attending: Internal Medicine

## 2019-03-18 DIAGNOSIS — Z20822 Contact with and (suspected) exposure to covid-19: Secondary | ICD-10-CM

## 2019-03-19 LAB — NOVEL CORONAVIRUS, NAA: SARS-CoV-2, NAA: NOT DETECTED

## 2019-04-01 ENCOUNTER — Ambulatory Visit: Payer: PRIVATE HEALTH INSURANCE | Attending: Internal Medicine

## 2019-04-01 DIAGNOSIS — Z20822 Contact with and (suspected) exposure to covid-19: Secondary | ICD-10-CM

## 2019-04-02 LAB — NOVEL CORONAVIRUS, NAA: SARS-CoV-2, NAA: NOT DETECTED

## 2020-04-22 DIAGNOSIS — Z125 Encounter for screening for malignant neoplasm of prostate: Secondary | ICD-10-CM | POA: Diagnosis not present

## 2020-04-22 DIAGNOSIS — E785 Hyperlipidemia, unspecified: Secondary | ICD-10-CM | POA: Diagnosis not present

## 2020-04-29 DIAGNOSIS — Z0000 Encounter for general adult medical examination without abnormal findings: Secondary | ICD-10-CM | POA: Diagnosis not present

## 2020-04-29 DIAGNOSIS — Z1339 Encounter for screening examination for other mental health and behavioral disorders: Secondary | ICD-10-CM | POA: Diagnosis not present

## 2020-04-29 DIAGNOSIS — Z1331 Encounter for screening for depression: Secondary | ICD-10-CM | POA: Diagnosis not present

## 2020-04-29 DIAGNOSIS — E785 Hyperlipidemia, unspecified: Secondary | ICD-10-CM | POA: Diagnosis not present

## 2020-08-14 IMAGING — DX DG CHEST 2V
2 series · 2 of 2 positions shown · non-contrast
Comparison: None.

CLINICAL DATA: Chest pain.  Dizziness and weakness.

EXAM:
CHEST - 2 VIEW

[x chest ap]
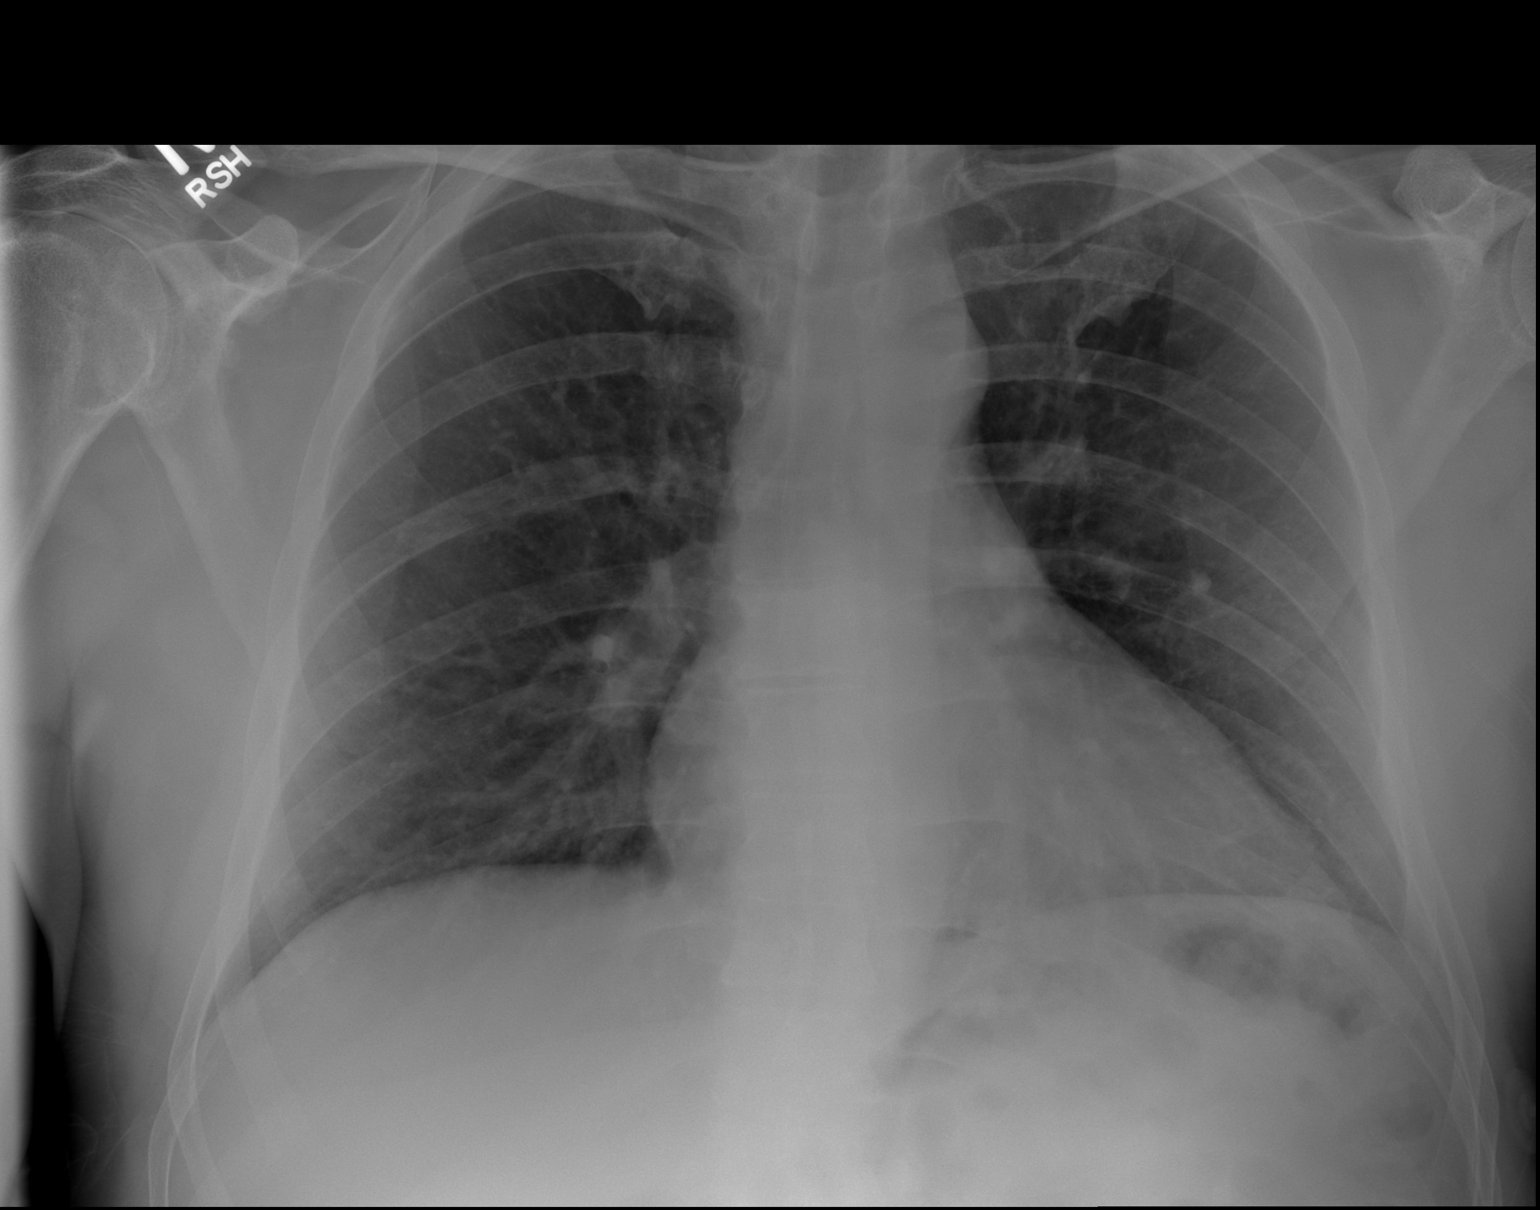

[w chest lat]
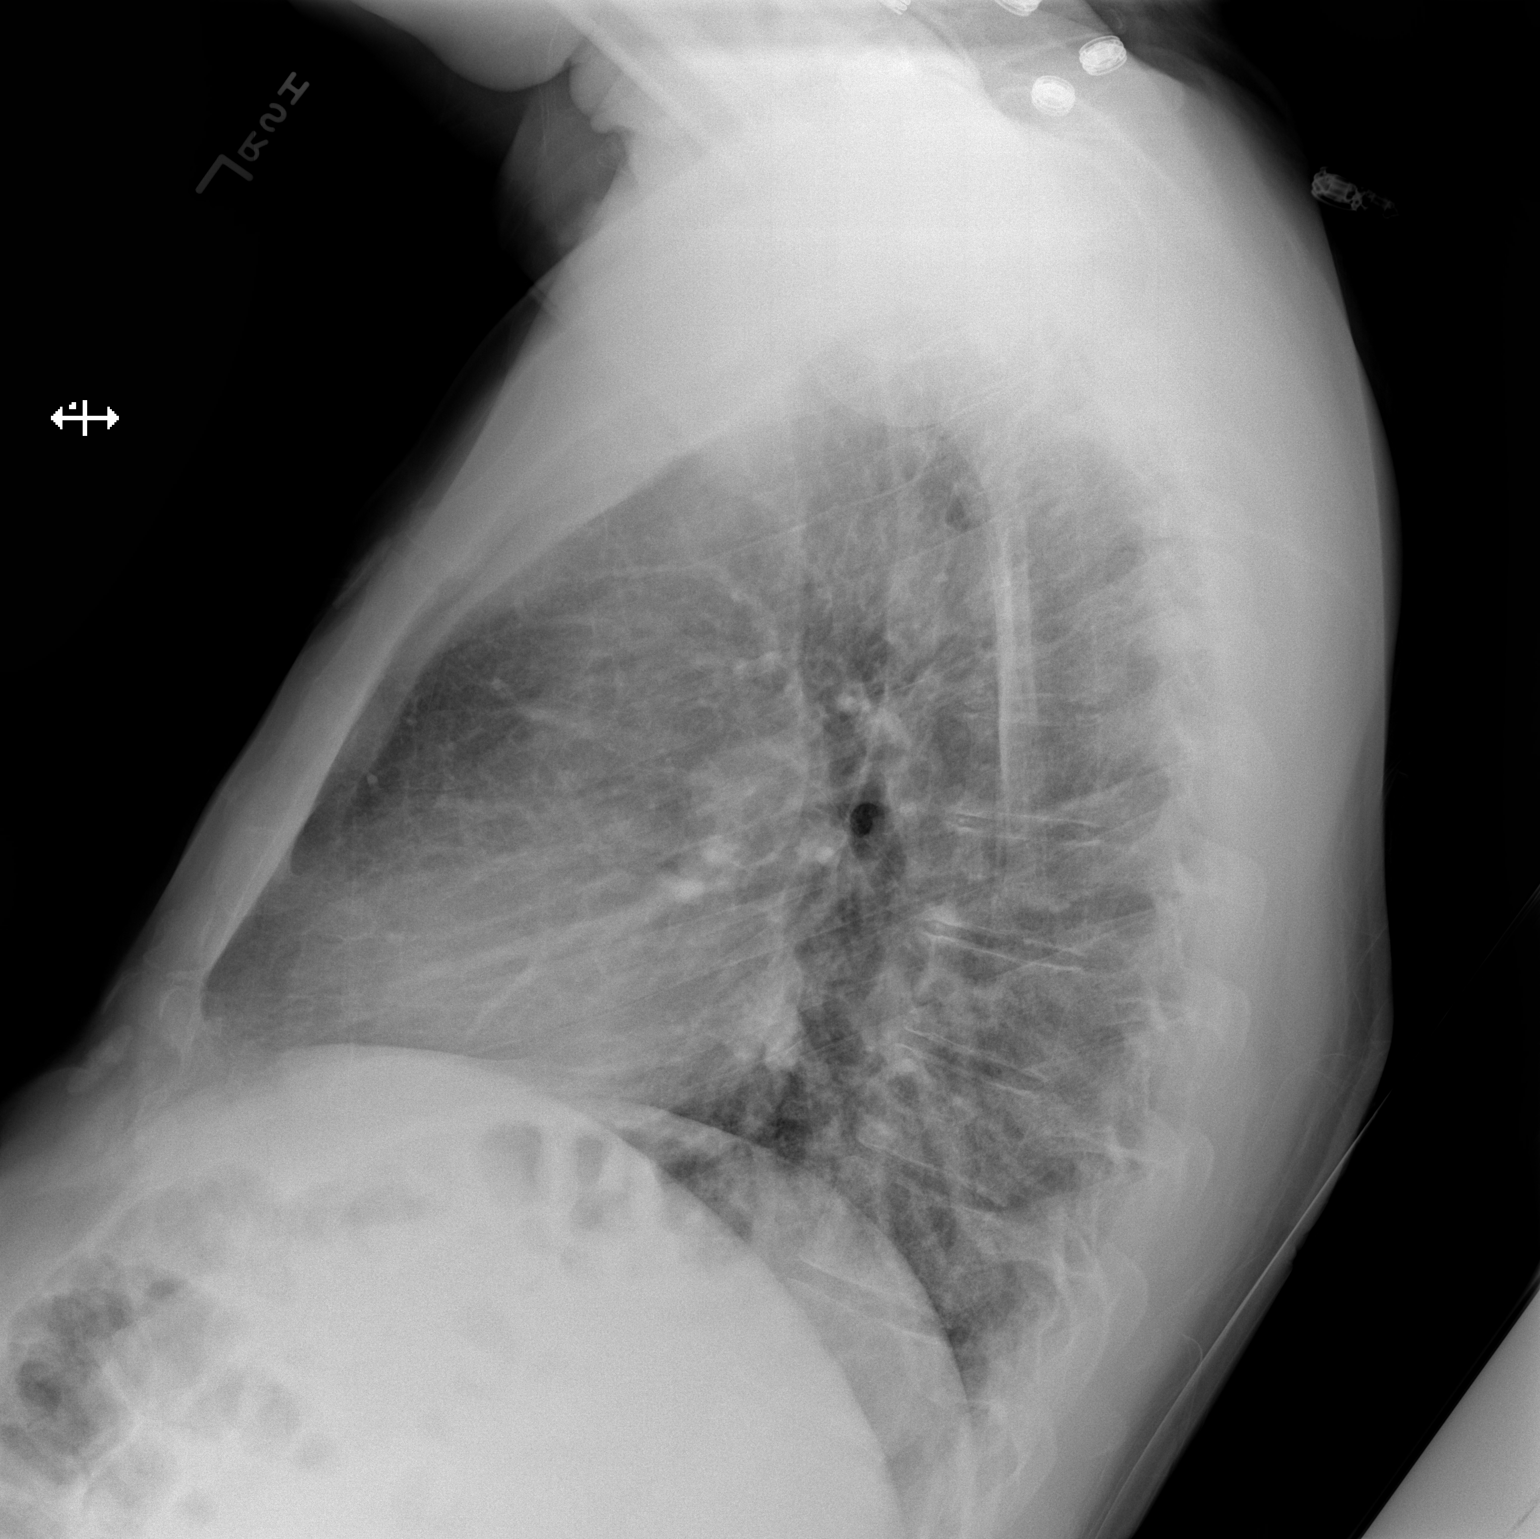

[2 of 2 positions shown; findings below may reference images not displayed]

FINDINGS: The heart size and mediastinal contours are within normal limits.
Both lungs are clear. The visualized skeletal structures are
unremarkable.
IMPRESSION: Normal exam.

## 2021-05-10 DIAGNOSIS — Z125 Encounter for screening for malignant neoplasm of prostate: Secondary | ICD-10-CM | POA: Diagnosis not present

## 2021-05-10 DIAGNOSIS — E785 Hyperlipidemia, unspecified: Secondary | ICD-10-CM | POA: Diagnosis not present

## 2021-05-17 DIAGNOSIS — Z1389 Encounter for screening for other disorder: Secondary | ICD-10-CM | POA: Diagnosis not present

## 2021-05-17 DIAGNOSIS — Z0000 Encounter for general adult medical examination without abnormal findings: Secondary | ICD-10-CM | POA: Diagnosis not present

## 2021-05-17 DIAGNOSIS — Z1331 Encounter for screening for depression: Secondary | ICD-10-CM | POA: Diagnosis not present

## 2021-05-17 DIAGNOSIS — E785 Hyperlipidemia, unspecified: Secondary | ICD-10-CM | POA: Diagnosis not present

## 2021-06-24 ENCOUNTER — Encounter: Payer: Self-pay | Admitting: Gastroenterology

## 2021-07-26 ENCOUNTER — Ambulatory Visit (AMBULATORY_SURGERY_CENTER): Payer: BC Managed Care – PPO | Admitting: *Deleted

## 2021-07-26 VITALS — Ht 76.0 in | Wt 223.0 lb

## 2021-07-26 DIAGNOSIS — Z1211 Encounter for screening for malignant neoplasm of colon: Secondary | ICD-10-CM

## 2021-07-26 MED ORDER — NA SULFATE-K SULFATE-MG SULF 17.5-3.13-1.6 GM/177ML PO SOLN: 1.0000 | Freq: Once | ORAL | 0 refills | Status: AC

## 2021-07-26 NOTE — Progress Notes (Signed)

## 2021-08-12 ENCOUNTER — Encounter: Payer: Self-pay | Admitting: Gastroenterology

## 2021-08-23 ENCOUNTER — Encounter: Payer: Self-pay | Admitting: Gastroenterology

## 2021-09-22 DIAGNOSIS — R202 Paresthesia of skin: Secondary | ICD-10-CM | POA: Diagnosis not present

## 2022-01-21 DIAGNOSIS — K219 Gastro-esophageal reflux disease without esophagitis: Secondary | ICD-10-CM | POA: Diagnosis not present

## 2022-02-23 DIAGNOSIS — M25511 Pain in right shoulder: Secondary | ICD-10-CM | POA: Diagnosis not present

## 2022-03-25 DIAGNOSIS — M25562 Pain in left knee: Secondary | ICD-10-CM | POA: Diagnosis not present

## 2022-04-14 DIAGNOSIS — M25562 Pain in left knee: Secondary | ICD-10-CM | POA: Diagnosis not present

## 2022-06-06 DIAGNOSIS — Z125 Encounter for screening for malignant neoplasm of prostate: Secondary | ICD-10-CM | POA: Diagnosis not present

## 2022-06-06 DIAGNOSIS — K219 Gastro-esophageal reflux disease without esophagitis: Secondary | ICD-10-CM | POA: Diagnosis not present

## 2022-06-06 DIAGNOSIS — E785 Hyperlipidemia, unspecified: Secondary | ICD-10-CM | POA: Diagnosis not present

## 2022-06-13 DIAGNOSIS — Z0000 Encounter for general adult medical examination without abnormal findings: Secondary | ICD-10-CM | POA: Diagnosis not present

## 2022-06-13 DIAGNOSIS — Z1331 Encounter for screening for depression: Secondary | ICD-10-CM | POA: Diagnosis not present

## 2022-06-13 DIAGNOSIS — Z1339 Encounter for screening examination for other mental health and behavioral disorders: Secondary | ICD-10-CM | POA: Diagnosis not present

## 2022-07-01 ENCOUNTER — Encounter: Payer: Self-pay | Admitting: Gastroenterology

## 2022-07-12 DIAGNOSIS — M25562 Pain in left knee: Secondary | ICD-10-CM | POA: Diagnosis not present

## 2022-07-15 DIAGNOSIS — M25519 Pain in unspecified shoulder: Secondary | ICD-10-CM | POA: Insufficient documentation

## 2022-07-21 ENCOUNTER — Ambulatory Visit (AMBULATORY_SURGERY_CENTER): Payer: BC Managed Care – PPO

## 2022-07-21 VITALS — Ht 76.0 in | Wt 230.0 lb

## 2022-07-21 DIAGNOSIS — Z1211 Encounter for screening for malignant neoplasm of colon: Secondary | ICD-10-CM

## 2022-07-21 MED ORDER — NA SULFATE-K SULFATE-MG SULF 17.5-3.13-1.6 GM/177ML PO SOLN: 1.0000 | Freq: Once | ORAL | 0 refills | Status: AC

## 2022-07-21 NOTE — Progress Notes (Signed)

## 2022-07-27 ENCOUNTER — Encounter: Payer: Self-pay | Admitting: Gastroenterology

## 2022-08-03 DIAGNOSIS — M25562 Pain in left knee: Secondary | ICD-10-CM | POA: Diagnosis not present

## 2022-08-10 ENCOUNTER — Ambulatory Visit (AMBULATORY_SURGERY_CENTER): Payer: BC Managed Care – PPO | Admitting: Gastroenterology

## 2022-08-10 ENCOUNTER — Encounter: Payer: Self-pay | Admitting: Gastroenterology

## 2022-08-10 VITALS — BP 108/68 | HR 66 | Temp 97.1°F | Resp 14 | Ht 76.0 in | Wt 230.0 lb

## 2022-08-10 DIAGNOSIS — Z1211 Encounter for screening for malignant neoplasm of colon: Secondary | ICD-10-CM | POA: Diagnosis not present

## 2022-08-10 MED ORDER — SODIUM CHLORIDE 0.9 % IV SOLN
500.0000 mL | Freq: Once | INTRAVENOUS | Status: DC
Start: 2022-08-10 — End: 2022-08-10

## 2022-08-10 NOTE — Op Note (Signed)
[facility] Patient Name: Antonio Craig Procedure Date: 08/10/2022 11:08 AM MRN: [id number] Endoscopist: Lorin Picket E. Tomasa Rand , MD, [id number] Age: 47 Referring MD:  Date of Birth: May 30, 1975 Gender: Male Account #: [id number] Procedure:                Colonoscopy Indications:              Screening for colorectal malignant neoplasm, This                            is the patient's first colonoscopy Medicines:                Monitored Anesthesia Care Procedure:                Pre-Anesthesia Assessment:                           - Prior to the procedure, a History and Physical                            was performed, and patient medications and                            allergies were reviewed. The patient's tolerance of                            previous anesthesia was also reviewed. The risks                            and benefits of the procedure and the sedation                            options and risks were discussed with the patient.                            All questions were answered, and informed consent                            was obtained. Prior Anticoagulants: The patient has                            taken no anticoagulant or antiplatelet agents. ASA                            Grade Assessment: I - A normal, healthy patient.                            After reviewing the risks and benefits, the patient                            was deemed in satisfactory condition to undergo the                            procedure.  After obtaining informed consent, the colonoscope                            was passed under direct vision. Throughout the                            procedure, the patient's blood pressure, pulse, and                            oxygen saturations were monitored continuously. The                            CF [id number] #[id number] was introduced through the anus                            and advanced to the the  terminal ileum, with                            identification of the appendiceal orifice and IC                            valve. The colonoscopy was performed without                            difficulty. The patient tolerated the procedure                            well. The quality of the bowel preparation was                            adequate. The terminal ileum, ileocecal valve,                            appendiceal orifice, and rectum were photographed.                            The bowel preparation used was SUPREP via split                            dose instruction. Scope In: 11:14:21 AM Scope Out: 11:29:41 AM Scope Withdrawal Time: 0 hours 10 minutes 38 seconds  Total Procedure Duration: 0 hours 15 minutes 20 seconds  Findings:                 The perianal and digital rectal examinations were                            normal. Pertinent negatives include normal                            sphincter tone and no palpable rectal lesions.                           The colon (entire examined portion) appeared normal.  The terminal ileum appeared normal.                           The retroflexed view of the distal rectum and anal                            verge was normal and showed no anal or rectal                            abnormalities. Complications:            No immediate complications. Estimated Blood Loss:     Estimated blood loss: none. Impression:               - The entire examined colon is normal.                           - The examined portion of the ileum was normal.                           - The distal rectum and anal verge are normal on                            retroflexion view.                           - No specimens collected. Recommendation:           - Patient has a contact number available for                            emergencies. The signs and symptoms of potential                            delayed complications were  discussed with the                            patient. Return to normal activities tomorrow.                            Written discharge instructions were provided to the                            patient.                           - Resume previous diet.                           - Continue present medications.                           - Repeat colonoscopy in 10 years for screening                            purposes. Micael Barb E. Tomasa Rand, MD 08/10/2022 11:36:57 AM This report has been signed electronically.

## 2022-08-10 NOTE — Progress Notes (Signed)
[facility] History and Physical   Primary Care Physician:  Pa, [facility]   Reason for Procedure:   Colon cancer screening  Plan:    Screening colonoscopy     HPI: Antonio Craig is a 47 y.o. male undergoing initial average risk screening colonoscopy.  He has no family history of colon cancer and no chronic GI symptoms.    Past Medical History:  Diagnosis Date   Allergy    High cholesterol    Migraine     Past Surgical History:  Procedure Laterality Date   NASAL SINUS SURGERY     SHOULDER SURGERY     THROAT SURGERY Bilateral    POLYP REMOVED   WISDOM TOOTH EXTRACTION      Prior to Admission medications   Medication Sig Start Date End Date Taking? Authorizing Provider  AJOVY 225 MG/1.5ML SOSY Inject 675 mg into the skin every 3 (three) months.  03/26/18  Yes [provider]  atorvastatin (LIPITOR) 20 MG tablet Take 20 mg by mouth daily. 02/28/18  Yes [provider]  aspirin 81 MG tablet Take 81 mg by mouth daily. Patient not taking: Reported on 07/26/2021    [provider]  ibuprofen (ADVIL,MOTRIN) 200 MG tablet Take 200 mg by mouth every 6 (six) hours as needed (for pain).    [provider]  meloxicam (MOBIC) 15 MG tablet Take 15 mg by mouth daily. 06/23/22   [provider]  SUMAtriptan (IMITREX) 50 MG tablet Take 50 mg by mouth once as needed for migraine.  03/23/18   [provider]    Current Outpatient Medications  Medication Sig Dispense Refill   AJOVY 225 MG/1.5ML SOSY Inject 675 mg into the skin every 3 (three) months.      atorvastatin (LIPITOR) 20 MG tablet Take 20 mg by mouth daily.     aspirin 81 MG tablet Take 81 mg by mouth daily. (Patient not taking: Reported on 07/26/2021)     ibuprofen (ADVIL,MOTRIN) 200 MG tablet Take 200 mg by mouth every 6 (six) hours as needed (for pain).     meloxicam (MOBIC) 15 MG tablet Take 15 mg by mouth daily.     SUMAtriptan (IMITREX) 50 MG  tablet Take 50 mg by mouth once as needed for migraine.      Current Facility-Administered Medications  Medication Dose Route Frequency Provider Last Rate Last Admin   0.9 %  sodium chloride infusion  500 mL Intravenous Once Jenel Lucks, MD        Allergies as of 08/10/2022   (No Known Allergies)    Family History  Problem Relation Age of Onset   Colon cancer Neg Hx    Colon polyps Neg Hx    Crohn's disease Neg Hx    Diabetes Neg Hx    Rectal cancer Neg Hx    Stomach cancer Neg Hx    Esophageal cancer Neg Hx     Social History   Socioeconomic History   Marital status: Married    Spouse name: Not on file   Number of children: Not on file   Years of education: Not on file   Highest education level: Not on file  Occupational History   Not on file  Tobacco Use   Smoking status: Never    Passive exposure: Never   Smokeless tobacco: Never  Vaping Use   Vaping Use: Never used  Substance and Sexual Activity   Alcohol use: Yes    Comment: WINE  PER NIGHT   Drug use: No   Sexual activity: Not on file  Other Topics Concern   Not on file  Social History Narrative   Not on file   Social Determinants of Health   Financial Resource Strain: Not on file  Food Insecurity: Not on file  Transportation Needs: Not on file  Physical Activity: Not on file  Stress: Not on file  Social Connections: Not on file  Intimate Partner Violence: Not on file    Review of Systems:  All other review of systems negative except as mentioned in the HPI.  Physical Exam: Vital signs BP 103/60 (BP Location: Right Arm, Patient Position: Sitting, Cuff Size: Normal)   Pulse 74   Temp (!) 97.1 F (36.2 C) (Temporal)   Ht 6\' 4"  (1.93 m)   Wt 230 lb (104.3 kg)   SpO2 99%   BMI 28.00 kg/m   General:   Alert,  Well-developed, well-nourished, pleasant and cooperative in NAD Airway:  Mallampati 1 Lungs:  Clear throughout to auscultation.   Heart:  Regular rate and rhythm; no murmurs,  clicks, rubs,  or gallops. Abdomen:  Soft, nontender and nondistended. Normal bowel sounds.   Neuro/Psych:  Normal mood and affect. A and O x 3   Antonio Craig E. Tomasa Rand, MD [facility]

## 2022-08-10 NOTE — Progress Notes (Signed)
A/O x 3, gd SR's, VSS, report to RN 

## 2022-08-10 NOTE — Progress Notes (Signed)
Vitals-Antonio Craig  Pt's states no medical or surgical changes since previsit or office visit. 

## 2022-08-10 NOTE — Patient Instructions (Signed)
-  repeat colonoscopy in 10 years for surveillance recommended.  -Continue present medications   YOU HAD AN ENDOSCOPIC PROCEDURE TODAY AT THE [facility]:   Refer to the procedure report that was given to you for any specific questions about what was found during the examination.  If the procedure report does not answer your questions, please call your gastroenterologist to clarify.  If you requested that your care partner not be given the details of your procedure findings, then the procedure report has been included in a sealed envelope for you to review at your convenience later.  YOU SHOULD EXPECT: Some feelings of bloating in the abdomen. Passage of more gas than usual.  Walking can help get rid of the air that was put into your GI tract during the procedure and reduce the bloating. If you had a lower endoscopy (such as a colonoscopy or flexible sigmoidoscopy) you may notice spotting of blood in your stool or on the toilet paper. If you underwent a bowel prep for your procedure, you may not have a normal bowel movement for a few days.  Please Note:  You might notice some irritation and congestion in your nose or some drainage.  This is from the oxygen used during your procedure.  There is no need for concern and it should clear up in a day or so.  SYMPTOMS TO REPORT IMMEDIATELY:  Following lower endoscopy (colonoscopy or flexible sigmoidoscopy):  Excessive amounts of blood in the stool  Significant tenderness or worsening of abdominal pains  Swelling of the abdomen that is new, acute  Fever of 100F or higher  For urgent or emergent issues, a gastroenterologist can be reached at any hour by calling [phone]. Do not use MyChart messaging for urgent concerns.    DIET:  We do recommend a small meal at first, but then you may proceed to your regular diet.  Drink plenty of fluids but you should avoid alcoholic beverages for 24 hours.  ACTIVITY:  You should plan to take it easy  for the rest of today and you should NOT DRIVE or use heavy machinery until tomorrow (because of the sedation medicines used during the test).    FOLLOW UP: Our staff will call the number listed on your records the next business day following your procedure.  We will call around 7:15- 8:00 am to check on you and address any questions or concerns that you may have regarding the information given to you following your procedure. If we do not reach you, we will leave a message.     If any biopsies were taken you will be contacted by phone or by letter within the next 1-3 weeks.  Please call us at [phone] if you have not heard about the biopsies in 3 weeks.    SIGNATURES/CONFIDENTIALITY: You and/or your care partner have signed paperwork which will be entered into your electronic medical record.  These signatures attest to the fact that that the information above on your After Visit Summary has been reviewed and is understood.  Full responsibility of the confidentiality of this discharge information lies with you and/or your care-partner.    

## 2022-08-11 ENCOUNTER — Telehealth: Payer: Self-pay

## 2022-08-11 NOTE — Telephone Encounter (Signed)
No answer, left message to call if having any issues or concerns, B.Keiandre Cygan RN 

## 2022-09-12 DIAGNOSIS — H5211 Myopia, right eye: Secondary | ICD-10-CM | POA: Diagnosis not present

## 2022-12-20 ENCOUNTER — Other Ambulatory Visit: Payer: Self-pay

## 2022-12-20 ENCOUNTER — Emergency Department (HOSPITAL_COMMUNITY)
Admission: EM | Admit: 2022-12-20 | Discharge: 2022-12-20 | Disposition: A | Discharge status: Home / Self Care | Payer: No Typology Code available for payment source | Attending: Emergency Medicine | Admitting: Emergency Medicine

## 2022-12-20 ENCOUNTER — Encounter (HOSPITAL_COMMUNITY): Payer: Self-pay

## 2022-12-20 DIAGNOSIS — Z7982 Long term (current) use of aspirin: Secondary | ICD-10-CM | POA: Diagnosis not present

## 2022-12-20 DIAGNOSIS — E876 Hypokalemia: Secondary | ICD-10-CM | POA: Insufficient documentation

## 2022-12-20 DIAGNOSIS — I959 Hypotension, unspecified: Secondary | ICD-10-CM | POA: Diagnosis not present

## 2022-12-20 DIAGNOSIS — R55 Syncope and collapse: Secondary | ICD-10-CM | POA: Insufficient documentation

## 2022-12-20 DIAGNOSIS — E860 Dehydration: Secondary | ICD-10-CM

## 2022-12-20 DIAGNOSIS — R42 Dizziness and giddiness: Secondary | ICD-10-CM | POA: Diagnosis not present

## 2022-12-20 LAB — TROPONIN I (HIGH SENSITIVITY): Troponin I (High Sensitivity): <2 ng/L (ref <18)

## 2022-12-20 LAB — BASIC METABOLIC PANEL
Anion gap: 8 (ref 5–15)
BUN: 15 mg/dL (ref 6–20)
CO2: 21 mmol/L — ABNORMAL LOW (ref 22–32)
Calcium: 8.2 mg/dL — ABNORMAL LOW (ref 8.9–10.3)
Chloride: 106 mmol/L (ref 98–111)
Creatinine, Ser: 0.94 mg/dL (ref 0.61–1.24)
GFR, Estimated: >60 mL/min (ref >60)
Glucose, Bld: 132 mg/dL — ABNORMAL HIGH (ref 70–99)
Potassium: 3.2 mmol/L — ABNORMAL LOW (ref 3.5–5.1)
Sodium: 135 mmol/L (ref 135–145)

## 2022-12-20 LAB — CBC
HCT: 37.9 % — ABNORMAL LOW (ref 39.0–52.0)
Hemoglobin: 13.4 g/dL (ref 13.0–17.0)
MCH: 32.9 pg (ref 26.0–34.0)
MCHC: 35.4 g/dL (ref 30.0–36.0)
MCV: 93.1 fL (ref 80.0–100.0)
Platelets: 269 10*3/uL (ref 150–400)
RBC: 4.07 MIL/uL — ABNORMAL LOW (ref 4.22–5.81)
RDW: 11.7 % (ref 11.5–15.5)
WBC: 9.2 10*3/uL (ref 4.0–10.5)
nRBC: 0 % (ref 0.0–0.2)

## 2022-12-20 MED ORDER — POTASSIUM CHLORIDE CRYS ER 20 MEQ PO TBCR
40.0000 meq | EXTENDED_RELEASE_TABLET | Freq: Once | ORAL | Status: AC
  Administered 2022-12-20: 40 meq via ORAL
  Filled 2022-12-20: qty 2

## 2022-12-20 MED ORDER — SODIUM CHLORIDE 0.9 % IV BOLUS
1000.0000 mL | Freq: Once | INTRAVENOUS | Status: AC
  Administered 2022-12-20: 1000 mL via INTRAVENOUS

## 2022-12-20 NOTE — ED Provider Notes (Signed)
[place] EMERGENCY DEPARTMENT AT [facility] Provider Note   CSN: [id number] Arrival date & time: 12/20/22  0111     History  Chief Complaint  Patient presents with   Loss of Consciousness    Antonio Craig is a 47 y.o. male with past medical history significant for migraines, hyperlipidemia who presents with concern for syncopal episode at home.  Patient reports she was about to head to bed when he became very dizzy and clammy, sat down to drink some water but did not improved so called 911.  On EMS arrival BP was 120/70 but on standing patient had blood pressure 50/30, became very dizzy, sat back down and did pass out on the couch.  Received 700 mL LR and route.  He does report poor oral hydration, and heavy caffeine intake, had a glass and a half of wine last night, otherwise with no abnormal alcohol intake, drug use.  Reports he uses a statin, and some chronic medication for migraines, no previous history of heart attack, stroke.  He reports similar episode of syncope around 2 months ago but did not seek medical attention at that time.   Loss of Consciousness      Home Medications Prior to Admission medications   Medication Sig Start Date End Date Taking? Authorizing Provider  AJOVY 225 MG/1.5ML SOSY Inject 675 mg into the skin every 3 (three) months.  03/26/18   [provider]  aspirin 81 MG tablet Take 81 mg by mouth daily. Patient not taking: Reported on 07/26/2021    [provider]  atorvastatin (LIPITOR) 20 MG tablet Take 20 mg by mouth daily. 02/28/18   [provider]  ibuprofen (ADVIL,MOTRIN) 200 MG tablet Take 200 mg by mouth every 6 (six) hours as needed (for pain).    [provider]  meloxicam (MOBIC) 15 MG tablet Take 15 mg by mouth daily. 06/23/22   [provider]  SUMAtriptan (IMITREX) 50 MG tablet Take 50 mg by mouth once as needed for migraine.  03/23/18   [provider]      Allergies    Patient has  no known allergies.    Review of Systems   Review of Systems  Cardiovascular:  Positive for syncope.  All other systems reviewed and are negative.   Physical Exam Updated Vital Signs BP 109/67 (BP Location: Right Arm)   Pulse 65   Temp 97.9 F (36.6 C) (Oral)   Resp 16   Ht 6\' 4"  (1.93 m)   Wt 99.8 kg   SpO2 100%   BMI 26.78 kg/m  Physical Exam Vitals and nursing note reviewed.  Constitutional:      General: He is not in acute distress.    Appearance: Normal appearance.  HENT:     Head: Normocephalic and atraumatic.  Eyes:     General:        Right eye: No discharge.        Left eye: No discharge.  Cardiovascular:     Rate and Rhythm: Normal rate and regular rhythm.     Heart sounds: No murmur heard.    No friction rub. No gallop.  Pulmonary:     Effort: Pulmonary effort is normal.     Breath sounds: Normal breath sounds.  Abdominal:     General: Bowel sounds are normal.     Palpations: Abdomen is soft.  Skin:    General: Skin is warm and dry.     Capillary Refill: Capillary refill  takes less than 2 seconds.  Neurological:     Mental Status: He is alert and oriented to person, place, and time.     Comments: Cranial nerves II through XII grossly intact.  Intact finger-nose, intact heel-to-shin.  Romberg negative, gait normal.  Alert and oriented x3.  Moves all 4 limbs spontaneously, normal coordination.  No pronator drift.  Intact strength 5 out of 5 bilateral upper and lower extremities.   Psychiatric:        Mood and Affect: Mood normal.        Behavior: Behavior normal.     ED Results / Procedures / Treatments   Labs (all labs ordered are listed, but only abnormal results are displayed) Labs Reviewed - No data to display  EKG None  Radiology No results found.  Procedures Procedures    Medications Ordered in ED Medications - No data to display  ED Course/ Medical Decision Making/ A&P                                 Medical Decision  Making Amount and/or Complexity of Data Reviewed Labs: ordered.  Risk Prescription drug management.   This patient is a 47 y.o. male  who presents to the ED for concern of syncope, collapse, hypotension.   Differential diagnoses prior to evaluation: The emergent differential diagnosis includes, but is not limited to,  CVA, ACS, arrhythmia, vasovagal syncope, orthostatic hypotension, sepsis, hypoglycemia, electrolyte disturbance, respiratory failure, symptomatic anemia, dehydration, heat injury, polypharmacy, malignancy, anxiety/panic attack.  . This is not an exhaustive differential.   Past Medical History / Co-morbidities / Social History: migraines, hyperlipidemia  Physical Exam: Physical exam performed. The pertinent findings include: Vital signs stable, orthostatic vital signs improved after fluid resuscitation, no focal neurodeficits.  No fever.  No tachycardia.  Lab Tests/Imaging studies: I personally interpreted labs/imaging and the pertinent results include: CBC overall unremarkable, BMP notable for mild hypokalemia to potassium 3.2, initial troponin less than 2..   Cardiac monitoring: EKG obtained and interpreted by myself and attending physician which shows: Normal sinus rhythm   Medications: I ordered medication including fluid bolus, potassium chloride for dehydration, hypokalemia.  I have reviewed the patients home medicines and have made adjustments as needed.   Disposition: After consideration of the diagnostic results and the patients response to treatment, I feel that patient with syncopal episode with orthostatic vital signs at the scene, suspicious for dehydration, improved after fluid resuscitation, patient feeling better, not dizzy during orthostatic vital signs.  Stable for discharge, encouraged close PCP follow-up for potassium recheck.Marland Kitchen   emergency department workup does not suggest an emergent condition requiring admission or immediate intervention beyond what  has been performed at this time. The plan is: as above. The patient is safe for discharge and has been instructed to return immediately for worsening symptoms, change in symptoms or any other concerns.  Final Clinical Impression(s) / ED Diagnoses Final diagnoses:  None    Rx / DC Orders ED Discharge Orders     None         Olene Floss, PA-C 12/20/22 [id number]    Gilda Crease, MD 12/20/22 [phone]

## 2022-12-20 NOTE — Discharge Instructions (Signed)
Recommend that you plenty of fluids, including electrolyte containing fluids such as Pedialyte, Gatorade, you may want to follow-up with your primary care doctor within the next 2 weeks to recheck your potassium.  If you continue have feeling of lightheadedness, or like you are going to pass out, especially if you have a feeling of heart racing or chest pain associated please return to the emergency department for further evaluation.

## 2022-12-20 NOTE — ED Triage Notes (Signed)
Pt BIB [facility] from home following a syncopal episode. Pt states was about to head to bed when he became very dizzy and clammy, sat down and drank some water but he did not improve so he called 911. Upon EMS arrival pts BP sitting was 120/70, upon standing the pt the BP was 50/30 and the pt became very dizzy again, sat back down and did pass out while sitting on the couch. Pt received 700 ml LR en route, does states he drank 1.5 glasses of wine yesterday between 6:30-9pm. Last BP EMS got en route was 100/66. Pt denies pain, states he just feels woozy. Endorses a syncopal episode 2 months but did not seek medical attention then.

## 2023-01-04 ENCOUNTER — Telehealth: Payer: Self-pay

## 2023-01-04 NOTE — Telephone Encounter (Signed)
Transition Care Management Unsuccessful Follow-up Telephone Call  Date of discharge and from where:  12/20/2022 [facility]  Attempts:  1st Attempt  Reason for unsuccessful TCM follow-up call:  Left voice message  Addilyne Backs [facility]  [facility], [facility] Resource Care Guide Direct Dial: [phone]  Website: [email or web address]

## 2023-01-05 ENCOUNTER — Telehealth: Payer: Self-pay

## 2023-01-05 NOTE — Telephone Encounter (Signed)
Transition Care Management Unsuccessful Follow-up Telephone Call  Date of discharge and from where:  12/20/2022 [facility]  Attempts:  2nd Attempt  Reason for unsuccessful TCM follow-up call:  Left voice message  Girtrude Enslin [facility]  [facility], [facility] Resource Care Guide Direct Dial: [phone]  Website: [email or web address]

## 2023-01-27 ENCOUNTER — Encounter: Payer: Self-pay | Admitting: Cardiology

## 2023-01-27 ENCOUNTER — Ambulatory Visit: Payer: No Typology Code available for payment source | Attending: Cardiology | Admitting: Cardiology

## 2023-01-27 VITALS — BP 120/77 | HR 71 | Ht 76.0 in | Wt 226.0 lb

## 2023-01-27 DIAGNOSIS — R55 Syncope and collapse: Secondary | ICD-10-CM | POA: Diagnosis not present

## 2023-01-27 NOTE — Progress Notes (Signed)
Cardiology Office Note   Date:  01/27/2023   ID:  Antonio Craig, DOB 30-Jun-1975, MRN [id number]  PCP:  [facility]  Cardiologist:   Rollene Rotunda, MD Referring:  Kathaleen Maser, NP  Chief Complaint  Patient presents with   Loss of Consciousness      History of Present Illness: Antonio Craig is a 47 y.o. male who presents for evaluation of syncope.   He was referred by [facility].  He was in the emergency room on 11/5 with syncope.  I reviewed these records for this visit.  He said that he has had 2 episodes of syncope.  One was while he was watching football.  He got up walked about 10 feet and developed a cold sweat.  He grabbed some water and then sat down in a chair and passed out from the chair going down to the floor.  He drink some water and went to bed.  He had another episode on November 5.  He was standing up cleaning the kitchen.  He again felt cold and sweaty and dizzy and he sat down on the floor and had a loss of consciousness.  He called EMS.  I reviewed these records.  When he arrived he was sitting on the floor and was not in any distress.  They checked him he had there he had significant orthostatic changes.  He actually had another syncopal episode while they were there.  He was hydrated on the way to the emergency room.  EKG was nonacute. He was slightly hypokalemia.  There were no other abnormalities.  He does report that he has been drinking some excessive caffeine.  He needs usually drinks couple glasses of wine in the evenings.  He has not been having any palpitations otherwise.  He has not had any presyncope or syncope.  Feels better since taking himself down on caffeine.  He was having more lightheadedness.  This seems to be improved.   his only past cardiac history was some anxiety and heart racing in 2020.  He does exercise and gets on the treadmill about 3 times a week and he has been doing fine with this.   Past Medical  History:  Diagnosis Date   Allergy    High cholesterol    Migraine     Past Surgical History:  Procedure Laterality Date   NASAL SINUS SURGERY     SHOULDER SURGERY     THROAT SURGERY Bilateral    POLYP REMOVED   WISDOM TOOTH EXTRACTION       Current Outpatient Medications  Medication Sig Dispense Refill   AJOVY 225 MG/1.5ML SOSY Inject 675 mg into the skin every 3 (three) months.      atorvastatin (LIPITOR) 20 MG tablet Take 20 mg by mouth daily.     SUMAtriptan (IMITREX) 50 MG tablet Take 50 mg by mouth once as needed for migraine.      aspirin 81 MG tablet Take 81 mg by mouth daily. (Patient not taking: Reported on 01/27/2023)     ibuprofen (ADVIL,MOTRIN) 200 MG tablet Take 200 mg by mouth every 6 (six) hours as needed (for pain). (Patient not taking: Reported on 01/27/2023)     meloxicam (MOBIC) 15 MG tablet Take 15 mg by mouth daily. (Patient not taking: Reported on 01/27/2023)     No current facility-administered medications for this visit.    Allergies:   Patient has no known allergies.    Social History:  The patient  reports that he has never smoked. He has never been exposed to tobacco smoke. He has never used smokeless tobacco. He reports current alcohol use. He reports that he does not use drugs.   Family History:  The patient's family history includes Alzheimer's disease in his father; Hypertension in his father.    ROS:  Please see the history of present illness.   Otherwise, review of systems are positive for none.   All other systems are reviewed and negative.    PHYSICAL EXAM: VS:  BP 120/77 (BP Location: Right Arm, Patient Position: Sitting, Cuff Size: Normal)   Pulse 71   Ht 6\' 4"  (1.93 m)   Wt 226 lb (102.5 kg)   SpO2 99%   BMI 27.51 kg/m  , BMI Body mass index is 27.51 kg/m. GENERAL:  Well appearing HEENT:  Pupils equal round and reactive, fundi not visualized, oral mucosa unremarkable NECK:  No jugular venous distention, waveform within normal  limits, carotid upstroke brisk and symmetric, no bruits, no thyromegaly LYMPHATICS:  No cervical, inguinal adenopathy LUNGS:  Clear to auscultation bilaterally BACK:  No CVA tenderness CHEST:  Unremarkable HEART:  PMI not displaced or sustained,S1 and S2 within normal limits, no S3, no S4, no clicks, no rubs, no murmurs ABD:  Flat, positive bowel sounds normal in frequency in pitch, no bruits, no rebound, no guarding, no midline pulsatile mass, no hepatomegaly, no splenomegaly EXT:  2 plus pulses throughout, no edema, no cyanosis no clubbing SKIN:  No rashes no nodules NEURO:  Cranial nerves II through XII grossly intact, motor grossly intact throughout [facility]:  Cognitively intact, oriented to person place and time   EKG:     12/20/2022, Sinus rhythm, rate 63, axis within normal limits, intervals within normal limits, no acute ST-T wave changes.    Recent Labs: 12/20/2022: BUN 15; Creatinine, Ser 0.94; Hemoglobin 13.4; Platelets 269; Potassium 3.2; Sodium 135    Lipid Panel No results found for: "CHOL", "TRIG", "HDL", "CHOLHDL", "VLDL", "LDLCALC", "LDLDIRECT"    Wt Readings from Last 3 Encounters:  01/27/23 226 lb (102.5 kg)  12/20/22 220 lb (99.8 kg)  08/10/22 230 lb (104.3 kg)      Other studies Reviewed: Additional studies/ records that were reviewed today include: Hospital and EMT records. Review of the above records demonstrates:  Please see elsewhere in the note.     ASSESSMENT AND PLAN:   SYNCOPE: Today in the office he was not orthostatic as below.  I think he probably has some orthostasis and vagal symptoms.  In the absence of any further events I would not suggest that monitoring is indicated.  His exam and EKG were unremarkable.  He otherwise has no symptoms.  Now that the patient has made some lifestyle changes he will let me know if he has any further events at which point he would need event monitoring.   Orthostatic VS for the past 72 hrs (Last 3 readings):   Orthostatic BP Patient Position BP Location Cuff Size Orthostatic Pulse  01/27/23 1209 106/70 Standing Right Arm Normal 75  01/27/23 1205 119/70 Standing Right Arm Normal 72  01/27/23 1204 -- Sitting Right Arm Normal --  01/27/23 1203 116/74 Lying left side Right Arm Normal 67      Current medicines are reviewed at length with the patient today.  The patient does not have concerns regarding medicines.  The following changes have been made:  no change  Labs/ tests ordered today include: None No orders  of the defined types were placed in this encounter.    Disposition:   FU with me as needed.   Signed, Rollene Rotunda, MD  01/27/2023 5:57 PM    [facility]

## 2023-01-27 NOTE — Patient Instructions (Signed)
 Medication Instructions:  No changes.  *If you need a refill on your cardiac medications before your next appointment, please call your pharmacy*     Follow-Up: At [facility], you and your health needs are our priority.  As part of our continuing mission to provide you with exceptional heart care, we have created designated Provider Care Teams.  These Care Teams include your primary Cardiologist (physician) and Advanced Practice Providers (APPs -  Physician Assistants and Nurse Practitioners) who all work together to provide you with the care you need, when you need it.  Your next appointment:    As needed.   Provider:   Rollene Rotunda, MD

## 2023-02-16 ENCOUNTER — Telehealth: Payer: Self-pay | Admitting: Cardiology

## 2023-02-16 NOTE — Telephone Encounter (Signed)
 Pt called in stating he has been dizzy the last few days. He states Dr. Antoine Poche told him to f/u if he was having these symptoms. He stated Dr. Antoine Poche mentioned maybe getting a monitor. Please advise.

## 2023-02-20 ENCOUNTER — Ambulatory Visit: Payer: No Typology Code available for payment source | Admitting: Cardiovascular Disease

## 2023-05-04 ENCOUNTER — Ambulatory Visit (INDEPENDENT_AMBULATORY_CARE_PROVIDER_SITE_OTHER): Payer: No Typology Code available for payment source | Admitting: Neurology

## 2023-05-04 ENCOUNTER — Encounter: Payer: Self-pay | Admitting: Neurology

## 2023-05-04 VITALS — BP 136/85 | HR 66 | Ht 76.0 in | Wt 225.0 lb

## 2023-05-04 DIAGNOSIS — I951 Orthostatic hypotension: Secondary | ICD-10-CM

## 2023-05-04 DIAGNOSIS — R55 Syncope and collapse: Secondary | ICD-10-CM | POA: Diagnosis not present

## 2023-05-04 NOTE — Progress Notes (Signed)
 [facility]  PATIENT: Antonio Craig DOB: 10-22-1975  REQUESTING CLINICIAN: Alysia Penna, MD HISTORY FROM: Patient/Chart review  REASON FOR VISIT: Syncope    HISTORICAL  CHIEF COMPLAINT:  Chief Complaint  Patient presents with   Room 12    Pt is here Alone. Pt states that he has had 3 episodes where he has passed out in October, November, and January of this year.     HISTORY OF PRESENT ILLNESS:  This is a 48 year old gentleman with past medical history of anxiety, migraine headaches who is presenting for evaluation of syncope.  Patient reports since October of last year he has been experiencing syncope, last episode was in January.  He mentioned during the first episode, he felt lightheaded, cold sweat, felt dizzy and he passed out on the chair.  Second episode was in November, again, stoop up and feel dizzy. Wife called EMS.  When EMS arrived, they noted severe change in his blood pressure, when sitting was 120/70 but upon standing it was 50/30 and patient was symptomatic, passed out again.  He was taken to the hospital and was hydrated.  He reports during that time, he was not drinking a lot of water, was drinking large amount of coffee and alcohol every night.  Since then, he has made some changes, stop drinking alcohol, and in November and then cut down his caffeine.  He did have another event in January.  He felt lightheaded, went on the ground and believes that he passed out for a few seconds.  Since then he has been doing well, he is actively maintaining adequate hydration, has not had any additional events.  He did follow-up with cardiology, cardiac suspicion for syncope was low and was advised to call back if his symptoms are getting worse.    OTHER MEDICAL CONDITIONS: Anxiety, Headaches    REVIEW OF SYSTEMS: Full 14 system review of systems performed and negative with exception of: As noted in the HPI   ALLERGIES: No Known Allergies  HOME  MEDICATIONS: Outpatient Medications Prior to Visit  Medication Sig Dispense Refill   AJOVY 225 MG/1.5ML SOSY Inject 675 mg into the skin every 3 (three) months.      atorvastatin (LIPITOR) 20 MG tablet Take 20 mg by mouth daily.     escitalopram (LEXAPRO) 10 MG tablet Take 10 mg by mouth daily.     SUMAtriptan (IMITREX) 50 MG tablet Take 50 mg by mouth once as needed for migraine.      ALPRAZolam (XANAX) 0.5 MG tablet SMARTSIG:1 Tablet(s) By Mouth Every 12 Hours (Patient not taking: Reported on 05/04/2023)     amoxicillin-clavulanate (AUGMENTIN) 875-125 MG tablet SMARTSIG:1 Tablet(s) By Mouth Every 12 Hours (Patient not taking: Reported on 05/04/2023)     aspirin 81 MG tablet Take 81 mg by mouth daily. (Patient not taking: Reported on 01/27/2023)     doxycycline (VIBRA-TABS) 100 MG tablet Take by mouth. (Patient not taking: Reported on 05/04/2023)     hydrOXYzine (ATARAX) 50 MG tablet Take 50 mg by mouth every 8 (eight) hours as needed. (Patient not taking: Reported on 05/04/2023)     ibuprofen (ADVIL,MOTRIN) 200 MG tablet Take 200 mg by mouth every 6 (six) hours as needed (for pain). (Patient not taking: Reported on 01/27/2023)     meloxicam (MOBIC) 15 MG tablet Take 15 mg by mouth daily. (Patient not taking: Reported on 05/04/2023)     valACYclovir (VALTREX) 500 MG tablet Take 1,000 mg by mouth 3 (three) times daily. (  Patient not taking: Reported on 05/04/2023)     No facility-administered medications prior to visit.    PAST MEDICAL HISTORY: Past Medical History:  Diagnosis Date   Allergy    High cholesterol    Migraine     PAST SURGICAL HISTORY: Past Surgical History:  Procedure Laterality Date   NASAL SINUS SURGERY     SHOULDER SURGERY     THROAT SURGERY Bilateral    POLYP REMOVED   WISDOM TOOTH EXTRACTION      FAMILY HISTORY: Family History  Problem Relation Age of Onset   Alzheimer's disease Father    Hypertension Father    Colon cancer Neg Hx    Colon polyps Neg Hx     Crohn's disease Neg Hx    Diabetes Neg Hx    Rectal cancer Neg Hx    Stomach cancer Neg Hx    Esophageal cancer Neg Hx     SOCIAL HISTORY: Social History   Socioeconomic History   Marital status: Married    Spouse name: Not on file   Number of children: Not on file   Years of education: Not on file   Highest education level: Not on file  Occupational History   Not on file  Tobacco Use   Smoking status: Never    Passive exposure: Never   Smokeless tobacco: Never  Vaping Use   Vaping status: Never Used  Substance and Sexual Activity   Alcohol use: Yes    Comment: WINE PER NIGHT   Drug use: No   Sexual activity: Not on file  Other Topics Concern   Not on file  Social History Narrative   Lives with wife.  Three children.      Social Drivers of Corporate investment banker Strain: Not on file  Food Insecurity: Not on file  Transportation Needs: Not on file  Physical Activity: Not on file  Stress: Not on file  Social Connections: Not on file  Intimate Partner Violence: Not on file    PHYSICAL EXAM  GENERAL EXAM/CONSTITUTIONAL: Vitals:  Vitals:   05/04/23 0953  BP: 136/85  Pulse: 66  Weight: 225 lb (102.1 kg)  Height: 6\' 4"  (1.93 m)   Body mass index is 27.39 kg/m. Wt Readings from Last 3 Encounters:  05/04/23 225 lb (102.1 kg)  01/27/23 226 lb (102.5 kg)  12/20/22 220 lb (99.8 kg)   Orthostatic Vitals for the past 48 hrs (Last 6 readings):  Patient Position Orthostatic BP Orthostatic Pulse BP Pulse BP Location Cuff Size  05/04/23 0953 Sitting -- -- 136/85 66 Right Arm Normal  05/04/23 1016 Standing 117/77 60 -- -- Right Arm Normal    Patient is in no distress; well developed, nourished and groomed; neck is supple  MUSCULOSKELETAL: Gait, strength, tone, movements noted in Neurologic exam below  NEUROLOGIC: MENTAL STATUS:      No data to display         awake, alert, oriented to person, place and time recent and remote memory intact normal  attention and concentration language fluent, comprehension intact, naming intact fund of knowledge appropriate  CRANIAL NERVE:  2nd, 3rd, 4th, 6th - Visual fields full to confrontation, extraocular muscles intact, no nystagmus 5th - facial sensation symmetric 7th - facial strength symmetric 8th - hearing intact 9th - palate elevates symmetrically, uvula midline 11th - shoulder shrug symmetric 12th - tongue protrusion midline  MOTOR:  normal bulk and tone, full strength in the BUE, BLE  SENSORY:  normal and  symmetric to light touch  COORDINATION:  finger-nose-finger, fine finger movements normal  REFLEXES:  deep tendon reflexes present and symmetric  GAIT/STATION:  normal   DIAGNOSTIC DATA (LABS, IMAGING, TESTING) - I reviewed patient records, labs, notes, testing and imaging myself where available.  Lab Results  Component Value Date   WBC 9.2 12/20/2022   HGB 13.4 12/20/2022   HCT 37.9 (L) 12/20/2022   MCV 93.1 12/20/2022   PLT 269 12/20/2022      Component Value Date/Time   NA 135 12/20/2022 0127   K 3.2 (L) 12/20/2022 0127   CL 106 12/20/2022 0127   CO2 21 (L) 12/20/2022 0127   GLUCOSE 132 (H) 12/20/2022 0127   BUN 15 12/20/2022 0127   CREATININE 0.94 12/20/2022 0127   CALCIUM 8.2 (L) 12/20/2022 0127   GFRNONAA >60 12/20/2022 0127   GFRAA >60 03/30/2018 1141   No results found for: "CHOL", "HDL", "LDLCALC", "LDLDIRECT", "TRIG", "CHOLHDL" No results found for: "HGBA1C" No results found for: "VITAMINB12" No results found for: "TSH"    ASSESSMENT AND PLAN  48 y.o. year old male with history of anxiety and migraine headache who is presenting after syncopal episode. He was noted to have orthostatic hypotension, and since then increase his fluid intake, discontinued alcohol and decreased his caffeine intake.  Patient has been improving, last episode was in January.  I have informed patient that I do believe his syncopal episodes are vasovagal, related to  dehydration/orthostatic hypotension.  Plan for now is to increase hydration, drink plenty of fluid and again refrain from alcohol and large amount of coffee.  Advised him to continue following up with PCP in to call us if his symptoms do get worse.  He voiced understanding.   1. Vasovagal syncope   2. Orthostatic hypotension      Patient Instructions  Continue with adequate hydration  Continue to follow up with PCP  Return if worse   No orders of the defined types were placed in this encounter.   No orders of the defined types were placed in this encounter.   Return if symptoms worsen or fail to improve.    Windell Norfolk, MD 05/04/2023, 11:41 AM  [facility] [address], [address] [place], [place] [address] [phone]

## 2023-05-04 NOTE — Patient Instructions (Signed)
 Continue with adequate hydration  Continue to follow up with PCP  Return if worse
